# Patient Record
Sex: Female | Born: 1995 | Race: White | Hispanic: No | Marital: Single | Smoking: Former smoker
Health system: Southern US, Community
[De-identification: ages and names within clinical notes are randomized; demographics above are authoritative.]

## PROBLEM LIST (undated history)

## (undated) ENCOUNTER — Inpatient Hospital Stay (HOSPITAL_COMMUNITY): Payer: Self-pay

## (undated) ENCOUNTER — Emergency Department (HOSPITAL_COMMUNITY): Admission: EM | Payer: BC Managed Care – PPO | Source: Home / Self Care

## (undated) DIAGNOSIS — E282 Polycystic ovarian syndrome: Secondary | ICD-10-CM

## (undated) DIAGNOSIS — A749 Chlamydial infection, unspecified: Secondary | ICD-10-CM

## (undated) DIAGNOSIS — O039 Complete or unspecified spontaneous abortion without complication: Secondary | ICD-10-CM

## (undated) HISTORY — DX: Complete or unspecified spontaneous abortion without complication: O03.9

## (undated) HISTORY — DX: Chlamydial infection, unspecified: A74.9

## (undated) HISTORY — PX: NO PAST SURGERIES: SHX2092

## (undated) HISTORY — DX: Polycystic ovarian syndrome: E28.2

---
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
## 2017-04-03 ENCOUNTER — Ambulatory Visit: Payer: Self-pay | Admitting: Women's Health

## 2017-04-03 DIAGNOSIS — Z0289 Encounter for other administrative examinations: Secondary | ICD-10-CM

## 2017-04-13 ENCOUNTER — Ambulatory Visit (INDEPENDENT_AMBULATORY_CARE_PROVIDER_SITE_OTHER): Payer: Self-pay | Admitting: Adult Health

## 2017-04-13 ENCOUNTER — Encounter (INDEPENDENT_AMBULATORY_CARE_PROVIDER_SITE_OTHER): Payer: Self-pay

## 2017-04-13 ENCOUNTER — Encounter: Payer: Self-pay | Admitting: Adult Health

## 2017-04-13 VITALS — BP 110/80 | HR 82 | Ht 66.5 in | Wt 131.0 lb

## 2017-04-13 DIAGNOSIS — Z3A08 8 weeks gestation of pregnancy: Secondary | ICD-10-CM

## 2017-04-13 DIAGNOSIS — Z3201 Encounter for pregnancy test, result positive: Secondary | ICD-10-CM

## 2017-04-13 DIAGNOSIS — N926 Irregular menstruation, unspecified: Secondary | ICD-10-CM

## 2017-04-13 DIAGNOSIS — O3680X0 Pregnancy with inconclusive fetal viability, not applicable or unspecified: Secondary | ICD-10-CM

## 2017-04-13 DIAGNOSIS — R5383 Other fatigue: Secondary | ICD-10-CM

## 2017-04-13 LAB — POCT URINE PREGNANCY: Preg Test, Ur: POSITIVE — AB

## 2017-04-13 NOTE — Progress Notes (Signed)
Subjective:     Patient ID: [name], female   DOB: 03/12/1995, 21 y.o.   MRN: [id number]  HPI [name] is a 22 year old white female, single in for UPT, has missed period and had +HPT, has history of PCOs and is worried.   Review of Systems +missed period,with +HPT +spotted brown yesterday +tired vomited x 1  Reviewed past medical,surgical, social and family history. Reviewed medications and allergies.     Objective:   Physical Exam BP 110/80 (BP Location: Left Arm, Patient Position: Sitting, Cuff Size: Normal)   Pulse 82   Ht 5' 6.5" (1.689 m)   Wt 131 lb (59.4 kg)   LMP 02/16/2017   BMI 20.83 kg/m UPT +,about 8 weeks by LMP with EDD 11/23/17.Skin warm and dry.Has numerous tattoos. Neck: mid line trachea, normal thyroid, good ROM, no lymphadenopathy noted. Lungs: clear to ausculation bilaterally. Cardiovascular: regular rate and rhythm.Abdomen is soft and non tender.    Assessment:     1. Pregnancy examination or test, positive result   2. [redacted] weeks gestation of pregnancy   3. Encounter to determine fetal viability of pregnancy, single or unspecified fetus       Plan:     Take OTC PNV Return in 1 week for dating US Review handouts on First trimester and by [employer] [facility]  Eat often  Apply for pregnancy medicaid

## 2017-04-13 NOTE — Patient Instructions (Signed)
First Trimester of Pregnancy The first trimester of pregnancy is from week 1 until the end of week 13 (months 1 through 3). A week after a sperm fertilizes an egg, the egg will implant on the wall of the uterus. This embryo will begin to develop into a baby. Genes from you and your partner will form the baby. The female genes will determine whether the baby will be a boy or a girl. At 6-8 weeks, the eyes and face will be formed, and the heartbeat can be seen on ultrasound. At the end of 12 weeks, all the baby's organs will be formed. Now that you are pregnant, you will want to do everything you can to have a healthy baby. Two of the most important things are to get good prenatal care and to follow your health care provider's instructions. Prenatal care is all the medical care you receive before the baby's birth. This care will help prevent, find, and treat any problems during the pregnancy and childbirth. Body changes during your first trimester Your body goes through many changes during pregnancy. The changes vary from woman to woman.  You may gain or lose a couple of pounds at first.  You may feel sick to your stomach (nauseous) and you may throw up (vomit). If the vomiting is uncontrollable, call your health care provider.  You may tire easily.  You may develop headaches that can be relieved by medicines. All medicines should be approved by your health care provider.  You may urinate more often. Painful urination may mean you have a bladder infection.  You may develop heartburn as a result of your pregnancy.  You may develop constipation because certain hormones are causing the muscles that push stool through your intestines to slow down.  You may develop hemorrhoids or swollen veins (varicose veins).  Your breasts may begin to grow larger and become tender. Your nipples may stick out more, and the tissue that surrounds them (areola) may become darker.  Your gums may bleed and may be  sensitive to brushing and flossing.  Dark spots or blotches (chloasma, mask of pregnancy) may develop on your face. This will likely fade after the baby is born.  Your menstrual periods will stop.  You may have a loss of appetite.  You may develop cravings for certain kinds of food.  You may have changes in your emotions from day to day, such as being excited to be pregnant or being concerned that something may go wrong with the pregnancy and baby.  You may have more vivid and strange dreams.  You may have changes in your hair. These can include thickening of your hair, rapid growth, and changes in texture. Some women also have hair loss during or after pregnancy, or hair that feels dry or thin. Your hair will most likely return to normal after your baby is born.  What to expect at prenatal visits During a routine prenatal visit:  You will be weighed to make sure you and the baby are growing normally.  Your blood pressure will be taken.  Your abdomen will be measured to track your baby's growth.  The fetal heartbeat will be listened to between weeks 10 and 14 of your pregnancy.  Test results from any previous visits will be discussed.  Your health care provider may ask you:  How you are feeling.  If you are feeling the baby move.  If you have had any abnormal symptoms, such as leaking fluid, bleeding, severe headaches,   or abdominal cramping.  If you are using any tobacco products, including cigarettes, chewing tobacco, and electronic cigarettes.  If you have any questions.  Other tests that may be performed during your first trimester include:  Blood tests to find your blood type and to check for the presence of any previous infections. The tests will also be used to check for low iron levels (anemia) and protein on red blood cells (Rh antibodies). Depending on your risk factors, or if you previously had diabetes during pregnancy, you may have tests to check for high blood  sugar that affects pregnant women (gestational diabetes).  Urine tests to check for infections, diabetes, or protein in the urine.  An ultrasound to confirm the proper growth and development of the baby.  Fetal screens for spinal cord problems (spina bifida) and Down syndrome.  HIV (human immunodeficiency virus) testing. Routine prenatal testing includes screening for HIV, unless you choose not to have this test.  You may need other tests to make sure you and the baby are doing well.  Follow these instructions at home: Medicines  Follow your health care provider's instructions regarding medicine use. Specific medicines may be either safe or unsafe to take during pregnancy.  Take a prenatal vitamin that contains at least 600 micrograms (mcg) of folic acid.  If you develop constipation, try taking a stool softener if your health care provider approves. Eating and drinking  Eat a balanced diet that includes fresh fruits and vegetables, whole grains, good sources of protein such as meat, eggs, or tofu, and low-fat dairy. Your health care provider will help you determine the amount of weight gain that is right for you.  Avoid raw meat and uncooked cheese. These carry germs that can cause birth defects in the baby.  Eating four or five small meals rather than three large meals a day may help relieve nausea and vomiting. If you start to feel nauseous, eating a few soda crackers can be helpful. Drinking liquids between meals, instead of during meals, also seems to help ease nausea and vomiting.  Limit foods that are high in fat and processed sugars, such as fried and sweet foods.  To prevent constipation: ? Eat foods that are high in fiber, such as fresh fruits and vegetables, whole grains, and beans. ? Drink enough fluid to keep your urine clear or pale yellow. Activity  Exercise only as directed by your health care provider. Most women can continue their usual exercise routine during  pregnancy. Try to exercise for 30 minutes at least 5 days a week. Exercising will help you: ? Control your weight. ? Stay in shape. ? Be prepared for labor and delivery.  Experiencing pain or cramping in the lower abdomen or lower back is a good sign that you should stop exercising. Check with your health care provider before continuing with normal exercises.  Try to avoid standing for long periods of time. Move your legs often if you must stand in one place for a long time.  Avoid heavy lifting.  Wear low-heeled shoes and practice good posture.  You may continue to have sex unless your health care provider tells you not to. Relieving pain and discomfort  Wear a good support bra to relieve breast tenderness.  Take warm sitz baths to soothe any pain or discomfort caused by hemorrhoids. Use hemorrhoid cream if your health care provider approves.  Rest with your legs elevated if you have leg cramps or low back pain.  If you develop   varicose veins in your legs, wear support hose. Elevate your feet for 15 minutes, 3-4 times a day. Limit salt in your diet. Prenatal care  Schedule your prenatal visits by the twelfth week of pregnancy. They are usually scheduled monthly at first, then more often in the last 2 months before delivery.  Write down your questions. Take them to your prenatal visits.  Keep all your prenatal visits as told by your health care provider. This is important. Safety  Wear your seat belt at all times when driving.  Make a list of emergency phone numbers, including numbers for family, friends, the hospital, and police and fire departments. General instructions  Ask your health care provider for a referral to a local prenatal education class. Begin classes no later than the beginning of month 6 of your pregnancy.  Ask for help if you have counseling or nutritional needs during pregnancy. Your health care provider can offer advice or refer you to specialists for help  with various needs.  Do not use hot tubs, steam rooms, or saunas.  Do not douche or use tampons or scented sanitary pads.  Do not cross your legs for long periods of time.  Avoid cat litter boxes and soil used by cats. These carry germs that can cause birth defects in the baby and possibly loss of the fetus by miscarriage or stillbirth.  Avoid all smoking, herbs, alcohol, and medicines not prescribed by your health care provider. Chemicals in these products affect the formation and growth of the baby.  Do not use any products that contain nicotine or tobacco, such as cigarettes and e-cigarettes. If you need help quitting, ask your health care provider. You may receive counseling support and other resources to help you quit.  Schedule a dentist appointment. At home, brush your teeth with a soft toothbrush and be gentle when you floss. Contact a health care provider if:  You have dizziness.  You have mild pelvic cramps, pelvic pressure, or nagging pain in the abdominal area.  You have persistent nausea, vomiting, or diarrhea.  You have a bad smelling vaginal discharge.  You have pain when you urinate.  You notice increased swelling in your face, hands, legs, or ankles.  You are exposed to fifth disease or chickenpox.  You are exposed to German measles (rubella) and have never had it. Get help right away if:  You have a fever.  You are leaking fluid from your vagina.  You have spotting or bleeding from your vagina.  You have severe abdominal cramping or pain.  You have rapid weight gain or loss.  You vomit blood or material that looks like coffee grounds.  You develop a severe headache.  You have shortness of breath.  You have any kind of trauma, such as from a fall or a car accident. Summary  The first trimester of pregnancy is from week 1 until the end of week 13 (months 1 through 3).  Your body goes through many changes during pregnancy. The changes vary from  woman to woman.  You will have routine prenatal visits. During those visits, your health care provider will examine you, discuss any test results you may have, and talk with you about how you are feeling. This information is not intended to replace advice given to you by your health care provider. Make sure you discuss any questions you have with your health care provider. Document Released: 12/13/2000 Document Revised: 12/01/2015 Document Reviewed: 12/01/2015 Elsevier Interactive Patient Education  2018 Elsevier   Inc.  

## 2017-04-15 ENCOUNTER — Encounter (HOSPITAL_COMMUNITY): Payer: Self-pay | Admitting: *Deleted

## 2017-04-15 ENCOUNTER — Inpatient Hospital Stay (HOSPITAL_COMMUNITY)
Admission: AD | Admit: 2017-04-15 | Discharge: 2017-04-15 | Disposition: A | Discharge status: Home / Self Care | Payer: Self-pay | Source: Ambulatory Visit | Attending: Obstetrics & Gynecology | Admitting: Obstetrics & Gynecology

## 2017-04-15 ENCOUNTER — Other Ambulatory Visit: Payer: Self-pay

## 2017-04-15 ENCOUNTER — Inpatient Hospital Stay (HOSPITAL_COMMUNITY): Payer: Self-pay

## 2017-04-15 DIAGNOSIS — Z87891 Personal history of nicotine dependence: Secondary | ICD-10-CM | POA: Insufficient documentation

## 2017-04-15 DIAGNOSIS — Z3A01 Less than 8 weeks gestation of pregnancy: Secondary | ICD-10-CM | POA: Insufficient documentation

## 2017-04-15 DIAGNOSIS — O23591 Infection of other part of genital tract in pregnancy, first trimester: Secondary | ICD-10-CM | POA: Insufficient documentation

## 2017-04-15 DIAGNOSIS — O283 Abnormal ultrasonic finding on antenatal screening of mother: Secondary | ICD-10-CM

## 2017-04-15 DIAGNOSIS — R1032 Left lower quadrant pain: Secondary | ICD-10-CM | POA: Insufficient documentation

## 2017-04-15 DIAGNOSIS — Z825 Family history of asthma and other chronic lower respiratory diseases: Secondary | ICD-10-CM | POA: Insufficient documentation

## 2017-04-15 DIAGNOSIS — O99281 Endocrine, nutritional and metabolic diseases complicating pregnancy, first trimester: Secondary | ICD-10-CM | POA: Insufficient documentation

## 2017-04-15 DIAGNOSIS — N760 Acute vaginitis: Secondary | ICD-10-CM

## 2017-04-15 DIAGNOSIS — E282 Polycystic ovarian syndrome: Secondary | ICD-10-CM | POA: Insufficient documentation

## 2017-04-15 DIAGNOSIS — R109 Unspecified abdominal pain: Secondary | ICD-10-CM

## 2017-04-15 DIAGNOSIS — B9689 Other specified bacterial agents as the cause of diseases classified elsewhere: Secondary | ICD-10-CM | POA: Insufficient documentation

## 2017-04-15 DIAGNOSIS — O3680X0 Pregnancy with inconclusive fetal viability, not applicable or unspecified: Secondary | ICD-10-CM

## 2017-04-15 DIAGNOSIS — O209 Hemorrhage in early pregnancy, unspecified: Secondary | ICD-10-CM | POA: Insufficient documentation

## 2017-04-15 DIAGNOSIS — O26891 Other specified pregnancy related conditions, first trimester: Secondary | ICD-10-CM | POA: Insufficient documentation

## 2017-04-15 LAB — CBC
HCT: 36.3 % (ref 36.0–46.0)
Hemoglobin: 12.5 g/dL (ref 12.0–15.0)
MCH: 31 pg (ref 26.0–34.0)
MCHC: 34.4 g/dL (ref 30.0–36.0)
MCV: 90.1 fL (ref 78.0–100.0)
PLATELETS: 162 10*3/uL (ref 150–400)
RBC: 4.03 MIL/uL (ref 3.87–5.11)
RDW: 12.4 % (ref 11.5–15.5)
WBC: 7 10*3/uL (ref 4.0–10.5)

## 2017-04-15 LAB — URINALYSIS, ROUTINE W REFLEX MICROSCOPIC
BILIRUBIN URINE: NEGATIVE
Glucose, UA: NEGATIVE mg/dL
HGB URINE DIPSTICK: NEGATIVE
Ketones, ur: NEGATIVE mg/dL
Leukocytes, UA: NEGATIVE
Nitrite: NEGATIVE
PH: 7 (ref 5.0–8.0)
Protein, ur: NEGATIVE mg/dL
SPECIFIC GRAVITY, URINE: 1.002 — AB (ref 1.005–1.030)

## 2017-04-15 LAB — WET PREP, GENITAL
SPERM: NONE SEEN
Trich, Wet Prep: NONE SEEN
Yeast Wet Prep HPF POC: NONE SEEN

## 2017-04-15 LAB — HCG, QUANTITATIVE, PREGNANCY: hCG, Beta Chain, Quant, S: 28499 m[IU]/mL — ABNORMAL HIGH (ref <5)

## 2017-04-15 MED ORDER — RHO D IMMUNE GLOBULIN 1500 UNIT/2ML IJ SOSY
300.0000 ug | PREFILLED_SYRINGE | Freq: Once | INTRAMUSCULAR | Status: AC
  Administered 2017-04-15: 300 ug via INTRAMUSCULAR
  Filled 2017-04-15: qty 2

## 2017-04-15 MED ORDER — METRONIDAZOLE 0.75 % VA GEL: 1.0000 | Freq: Two times a day (BID) | VAGINAL | 0 refills | Status: DC

## 2017-04-15 NOTE — MAU Provider Note (Addendum)
History     CSN: [id number]  Arrival date and time: 04/15/17 1847   First Provider Initiated Contact with Patient 04/15/17 1928      Chief Complaint  Patient presents with  . Vaginal Bleeding  . Abdominal Pain   HPI  [name] is a 22 y.o. G1P0000 at 8552w2d by unsure LMP who presents with abdominal pain and vaginal bleeding. Pt with history of PCOS and very irregular periods. Had first positive pregnancy test 4 wks ago with a negative test the week prior to that.  Reports lower abdominal pain for the last week. Feels like pain is in her cervix and her LLQ. Pain is cramping and intermittent. Rates pain 5/10. Has not treated. Has also noticed brown spotting for the last week. Some nausea; no vomiting or diarrhea. Denies dysuria or vaginal discharge. Last intercourse was 1 week ago.   OB History    Gravida  1   Para  0   Term  0   Preterm  0   AB  0   Living  0     SAB  0   TAB  0   Ectopic  0   Multiple  0   Live Births              Past Medical History:  Diagnosis Date  . PCOS (polycystic ovarian syndrome)     Past Surgical History:  Procedure Laterality Date  . NO PAST SURGERIES      Family History  Problem Relation Age of Onset  . Pulmonary embolism Father   . COPD Father   . Cancer Mother        brain    Social History   Tobacco Use  . Smoking status: Former Smoker    Types: Cigarettes  . Smokeless tobacco: Never Used  Substance Use Topics  . Alcohol use: No    Alcohol/week: 0.0 oz  . Drug use: No    Allergies: No Known Allergies  Medications Prior to Admission  Medication Sig Dispense Refill Last Dose  . Prenatal Vit-Fe Fumarate-FA (MULTIVITAMIN-PRENATAL) 27-0.8 MG TABS tablet Take 1 tablet by mouth daily at 12 noon.       Review of Systems  Constitutional: Negative.   Gastrointestinal: Positive for abdominal pain and nausea. Negative for constipation, diarrhea and vomiting.  Genitourinary: Positive for vaginal bleeding.  Negative for dyspareunia, dysuria and vaginal discharge.   Physical Exam   Blood pressure 128/77, pulse 87, temperature 98.7 F (37.1 C), temperature source Oral, resp. rate 18, weight 131 lb (59.4 kg), last menstrual period 02/16/2017, SpO2 100 %.  Physical Exam  Nursing note and vitals reviewed. Constitutional: She is oriented to person, place, and time. She appears well-developed and well-nourished. No distress.  HENT:  Head: Normocephalic and atraumatic.  Eyes: Conjunctivae are normal. Right eye exhibits no discharge. Left eye exhibits no discharge. No scleral icterus.  Neck: Normal range of motion.  Respiratory: Effort normal. No respiratory distress.  GI: Soft. There is tenderness in the left lower quadrant. There is no rigidity, no rebound and no guarding.  Genitourinary: Cervix exhibits no motion tenderness and no friability. Left adnexum displays tenderness. No bleeding in the vagina. Vaginal discharge (moderate amount of tan watery discharge) found.  Genitourinary Comments: Cervix closed  Neurological: She is alert and oriented to person, place, and time.  Skin: Skin is warm and dry. She is not diaphoretic.  Psychiatric: She has a normal mood and affect. Her behavior is normal. Judgment  and thought content normal.    MAU Course  Procedures Results for orders placed or performed during the hospital encounter of 04/15/17 (from the past 24 hour(s))  Urinalysis, Routine w reflex microscopic     Status: Abnormal   Collection Time: 04/15/17  6:50 PM  Result Value Ref Range   Color, Urine STRAW (A) YELLOW   APPearance CLEAR CLEAR   Specific Gravity, Urine 1.002 (L) 1.005 - 1.030   pH 7.0 5.0 - 8.0   Glucose, UA NEGATIVE NEGATIVE mg/dL   Hgb urine dipstick NEGATIVE NEGATIVE   Bilirubin Urine NEGATIVE NEGATIVE   Ketones, ur NEGATIVE NEGATIVE mg/dL   Protein, ur NEGATIVE NEGATIVE mg/dL   Nitrite NEGATIVE NEGATIVE   Leukocytes, UA NEGATIVE NEGATIVE  Wet prep, genital      Status: Abnormal   Collection Time: 04/15/17  7:50 PM  Result Value Ref Range   Yeast Wet Prep HPF POC NONE SEEN NONE SEEN   Trich, Wet Prep NONE SEEN NONE SEEN   Clue Cells Wet Prep HPF POC PRESENT (A) NONE SEEN   WBC, Wet Prep HPF POC MODERATE (A) NONE SEEN   Sperm NONE SEEN   CBC     Status: None   Collection Time: 04/15/17  7:51 PM  Result Value Ref Range   WBC 7.0 4.0 - 10.5 K/uL   RBC 4.03 3.87 - 5.11 MIL/uL   Hemoglobin 12.5 12.0 - 15.0 g/dL   HCT 40.9 81.1 - 91.4 %   MCV 90.1 78.0 - 100.0 fL   MCH 31.0 26.0 - 34.0 pg   MCHC 34.4 30.0 - 36.0 g/dL   RDW 78.2 95.6 - 21.3 %   Platelets 162 150 - 400 K/uL  ABO/Rh     Status: None (Preliminary result)   Collection Time: 04/15/17  7:51 PM  Result Value Ref Range   ABO/RH(D)      O NEG Performed at [facility], [address], [place], [place] [address]   hCG, quantitative, pregnancy     Status: Abnormal   Collection Time: 04/15/17  7:51 PM  Result Value Ref Range   hCG, Beta Chain, Quant, S 28,499 (H) <5 mIU/mL  Rh IG workup (includes ABO/Rh)     Status: None (Preliminary result)   Collection Time: 04/15/17  7:51 PM  Result Value Ref Range   Gestational Age(Wks) 8    ABO/RH(D) O NEG    Antibody Screen      NEG Performed at [facility], [address], [place], [place] [address]    Unit Number [id number]    Blood Component Type RHIG    Unit division 00    Status of Unit ALLOCATED    Transfusion Status OK TO TRANSFUSE     US Ob Less Than 14 Weeks With Ob Transvaginal  Result Date: 04/15/2017 CLINICAL DATA:  Pregnant, pain x2 weeks, spotting EXAM: OBSTETRIC <14 WK [place] AND TRANSVAGINAL OB US TECHNIQUE: Both transabdominal and transvaginal ultrasound examinations were performed for complete evaluation of the gestation as well as the maternal uterus, adnexal regions, and pelvic cul-de-sac. Transvaginal technique was performed to assess early pregnancy. COMPARISON:  None. FINDINGS: Intrauterine gestational sac:  Single Yolk sac:  Not Visualized. Embryo:  Not Visualized. MSD: 17.0 mm   6 w   4 d Subchorionic hemorrhage:  None visualized. Maternal uterus/adnexae: Bilateral ovaries are within normal limits, noting a right corpus luteal cyst. No free fluid. IMPRESSION: Single intrauterine gestational sac, measuring 6 weeks 4 days by mean sac diameter, without  fetal pole. Findings are suspicious but not yet definitive for failed pregnancy, given the absence of a visualized embryo despite the size of the gestational sac. Recommend follow-up US in 14 days for definitive diagnosis. This recommendation follows SRU consensus guidelines: Diagnostic Criteria for Nonviable Pregnancy Early in the First Trimester. Malva Limes Med 2013; 433:2951-88. Electronically Signed   By: Charline Bills M.D.   On: 04/15/2017 21:24   MDM +UPT UA, wet prep, GC/chlamydia, CBC, ABO/Rh, quant hCG, HIV, and [place] today to rule out ectopic pregnancy  Care turned over to [facility] CNM   Judeth Horn, NP 04/15/2017 8:08 PM   O Neg blood type- Rhogam work up ordered  Assessment and Plan   1. Pregnancy of unknown anatomic location   2. Vaginal bleeding in pregnancy, first trimester   3. Abdominal pain during pregnancy in first trimester   4. [redacted] weeks gestation of pregnancy   5. Bacterial vaginosis    -Discharge home in stable condition -Rx for metrogel sent to patient's pharmacy -Strict ectopic precautions discussed -Patient advised to follow-up with [facility] on Wednesday for repeat blood work, message sent -Patient may return to MAU as needed or if her condition were to change or worsen  AALIVIA MCGRAW, [place] 04/15/17 9:53 PM

## 2017-04-15 NOTE — Discharge Instructions (Signed)

## 2017-04-15 NOTE — MAU Note (Signed)
Saw OB a few days ago, it was end of the day and seemed to be rushing. Didn't seem to be listening, didn't have an US.  Has had cramping and spotting, and pain in left ovary. Cramping has been the entire time, last 1-2 wks has gotten worse. Started spotting on Wed.

## 2017-04-15 NOTE — MAU Note (Signed)
Pt reports spotting and pain. Spotting since Wednesday. Pain for a couple of weeks. Pt reports that she has had recurrent BV infections. Last one was about a year. Pt reports cramping that comes in waves on her left "ovary" and also near her cervix and bladder.

## 2017-04-16 LAB — RH IG WORKUP (INCLUDES ABO/RH)
ABO/RH(D): O NEG
ANTIBODY SCREEN: NEGATIVE
GESTATIONAL AGE(WKS): 8
Unit division: 0

## 2017-04-16 LAB — ABO/RH: ABO/RH(D): O NEG

## 2017-04-16 LAB — GC/CHLAMYDIA PROBE AMP (~~LOC~~) NOT AT ARMC
Chlamydia: NEGATIVE
Neisseria Gonorrhea: NEGATIVE

## 2017-04-16 LAB — HIV ANTIBODY (ROUTINE TESTING W REFLEX): HIV SCREEN 4TH GENERATION: NONREACTIVE

## 2017-04-18 ENCOUNTER — Other Ambulatory Visit: Payer: Self-pay

## 2017-04-18 DIAGNOSIS — O200 Threatened abortion: Secondary | ICD-10-CM

## 2017-04-19 ENCOUNTER — Telehealth: Payer: Self-pay | Admitting: Adult Health

## 2017-04-19 ENCOUNTER — Other Ambulatory Visit: Payer: Self-pay

## 2017-04-19 LAB — BETA HCG QUANT (REF LAB): HCG QUANT: 37861 m[IU]/mL

## 2017-04-19 NOTE — Telephone Encounter (Signed)
[facility]  [facility], which is rising but not doubling from 4/14 when was 28,499, need F/U US, call back

## 2017-04-19 NOTE — Telephone Encounter (Signed)
Patient called stating that she would like to know the results of her blood work.  °Please contact pt °

## 2017-04-19 NOTE — Telephone Encounter (Signed)
Left message to call about labs 

## 2017-04-27 ENCOUNTER — Ambulatory Visit (INDEPENDENT_AMBULATORY_CARE_PROVIDER_SITE_OTHER): Payer: Self-pay

## 2017-04-27 DIAGNOSIS — Z3A08 8 weeks gestation of pregnancy: Secondary | ICD-10-CM

## 2017-04-27 DIAGNOSIS — O3680X0 Pregnancy with inconclusive fetal viability, not applicable or unspecified: Secondary | ICD-10-CM

## 2017-04-27 DIAGNOSIS — O020 Blighted ovum and nonhydatidiform mole: Secondary | ICD-10-CM

## 2017-04-27 MED ORDER — MISOPROSTOL 200 MCG PO TABS: 800.0000 ug | ORAL_TABLET | Freq: Once | ORAL | 1 refills | Status: DC

## 2017-04-27 NOTE — Progress Notes (Signed)
US TA/TV:8+3 wks GS,no fetal pole or YS,normal ovaries bilat,GS 33.6 mm,Kim discussed results w/pt.

## 2017-04-27 NOTE — Progress Notes (Unsigned)
[facility] on today's us, so spoke w/ pt in u/s room.  Bonnie NeedlesCaroline L [name] is a 22 y.o. G1P0000 female at 6670w2d by early u/s. On 04/15/17 she went to MAU for pain and spotting, u/s revealed GS c/w 566w4d, no yolk sac or embryo, normal ovaries, no free fluid. BHCG was 28,499, she is O-, so she received Rhogam 04/15/17. Repeat HCG on 4/17: 37, 861. U/S today: US TA/TV:8+3 wks GS,no fetal pole or YS,normal ovaries bilat,GS 33.6 mm, c/w blighted ovum. Discussed options of expectant management vs. cytotec- prefers cytotec. Rx cytotec 800mcg pv x1, repeat in 48hrs if needed. Discussed normal expectations w/ miscarriage, as well as warning s/s to report/reasons to seek care. Will f/u in 1wk.  Cheral MarkerKimberly R. Rosemae Mcquown, CNM, [facility]NP-BC 04/27/2017 2:06 PM

## 2017-04-27 NOTE — Patient Instructions (Signed)
FACTS YOU SHOULD KNOW  About Early Pregnancy Loss  WHAT IS AN EARLY PREGNANCY LOSS? Once the egg is fertilized with the sperm and begins to develop, it attaches to the lining of the uterus. This early pregnancy tissue may not develop into an embryo (the beginning stage of a baby). Sometimes an embryo does develop but does not continue to grow. These problems can be seen on ultrasound.   MANAGEMNT OF EARLY PREGNANCY LOSS: About 4 out of 100 (0.25%) women will have a pregnancy loss in her lifetime.  One in five pregnancies is found to be an early pregnancy loss.  There are 3 ways to care for an early pregnancy loss:   (1) Surgery, (2) Medicine, (3) Waiting for you to pass the pregnancy on your own. The decision as to how to proceed after being diagnosed with and early pregnancy loss is an individual one.  The decision can be made only after appropriate counseling.  You need to weigh the pros and cons of the 3 choices. Then you can make the choice that works for you.  SURGERY (D&E) . Procedure over in 1 day . Requires being put to sleep . Bleeding may be light . Possible problems during surgery, including injury to womb(uterus) . Care provider has more control Medicine (CYTOTEC) . The complete procedure may take days to weeks . No Surgery . Bleeding may be heavy at times . There may be drug side effects . Patient has more control Waiting . You may choose to wait, in which case your own body may complete the passing of the abnormal early pregnancy on its own in about 2-4 weeks . Your bleeding may be heavy at times . There is a small possibility that you may need surgery if the bleeding is too much or not all of the pregnancy has passed.  CYTOTEC MANAGEMENT Prostaglandins (cytotec) are the most widely used drug for this purpose. They cause the uterus to cramp and contract. You will place the medicine yourself inside your vagina in the privacy of your home. Empting of the uterus should occur  within 3 days but the process may continue for several weeks. The bleeding may seem heavy at times.  INSTRUCTIONS: Take all 4 tablets of cytotec (800mcg total) at one time. This will cause a lot of cramping, you may have bleeding, and pass tissue, then the cramping and bleeding should get better. If you do not pass the tissue, then you can take 4 more tablets of cytotec (800mcg total) 48 hours after your first dose.  You will come back to have your blood drawn to make sure the pregnancy hormones are dropping in 1 week. Please call us if you have any questions.   POSSIBLE SIDE EFFECTS FROM CYTOTEC . Nausea  Vomiting . Diarrhea Fever . Chills  Hot Flashes Side effects  from the process of the early pregnancy loss include: . Cramping  Bleeding . Headaches  Dizziness RISKS: This is a low risk procedure. Less than 1 in 100 women has a complication. An incomplete passage of the early pregnancy may occur. Also, hemorrhage (heavy bleeding) could happen.  Rarely the pregnancy will not be passed completely. Excessively heavy bleeding may occur.  Your doctor may need to perform surgery to empty the uterus (D&E). Afterwards: Everybody will feel differently after the early pregnancy loss completion. You may have soreness or cramps for a day or two. You may have soreness or cramps for day or two.  You may have light   bleeding for up to 2 weeks. You may be as active as you feel like being. If you have any of the following problems you may call [facility] at [phone] or Maternity Admissions Unit at [phone] if it is after hours. . If you have pain that does not get better with pain medication . Bleeding that soaks through 2 thick full-sized sanitary pads in an hour . Cramps that last longer than 2 days . Foul smelling discharge . Fever above 100.4 degrees F Even if you do not have any of these symptoms, you should have a follow-up exam to make sure you are healing properly. Your next normal period will  usually start again in 4-6 week after the loss. You can get pregnant soon after the loss, so use birth control right away. Finally: Make sure all your questions are answered before during and after any procedure. Follow up with medical care and family planning methods.      

## 2017-05-02 DIAGNOSIS — O039 Complete or unspecified spontaneous abortion without complication: Secondary | ICD-10-CM

## 2017-05-02 HISTORY — DX: Complete or unspecified spontaneous abortion without complication: O03.9

## 2017-05-03 ENCOUNTER — Ambulatory Visit: Payer: Self-pay | Admitting: Women's Health

## 2017-05-14 ENCOUNTER — Inpatient Hospital Stay (HOSPITAL_COMMUNITY)
Admission: AD | Admit: 2017-05-14 | Discharge: 2017-05-14 | Discharge status: Against Medical Advice | Payer: Self-pay | Source: Ambulatory Visit | Attending: Obstetrics and Gynecology | Admitting: Obstetrics and Gynecology

## 2017-05-14 DIAGNOSIS — Z5321 Procedure and treatment not carried out due to patient leaving prior to being seen by health care provider: Secondary | ICD-10-CM | POA: Insufficient documentation

## 2017-05-14 NOTE — MAU Note (Signed)
Not in lobby

## 2017-05-14 NOTE — MAU Note (Signed)
Not on lobby 

## 2017-05-14 NOTE — MAU Note (Signed)
Pt reports she took cytotec on Fri for a blighted ovum and had bleeding but the bleeding worsened today and the pain got much worse.

## 2017-08-15 ENCOUNTER — Ambulatory Visit (INDEPENDENT_AMBULATORY_CARE_PROVIDER_SITE_OTHER): Payer: Self-pay | Admitting: Adult Health

## 2017-08-15 ENCOUNTER — Encounter: Payer: Self-pay | Admitting: Adult Health

## 2017-08-15 VITALS — BP 127/87 | HR 119 | Ht 66.5 in | Wt 124.5 lb

## 2017-08-15 DIAGNOSIS — N898 Other specified noninflammatory disorders of vagina: Secondary | ICD-10-CM | POA: Insufficient documentation

## 2017-08-15 DIAGNOSIS — B9689 Other specified bacterial agents as the cause of diseases classified elsewhere: Secondary | ICD-10-CM | POA: Insufficient documentation

## 2017-08-15 DIAGNOSIS — N926 Irregular menstruation, unspecified: Secondary | ICD-10-CM

## 2017-08-15 DIAGNOSIS — N760 Acute vaginitis: Secondary | ICD-10-CM

## 2017-08-15 DIAGNOSIS — Z3202 Encounter for pregnancy test, result negative: Secondary | ICD-10-CM

## 2017-08-15 LAB — POCT WET PREP (WET MOUNT)
Clue Cells Wet Prep Whiff POC: POSITIVE
WBC WET PREP: POSITIVE

## 2017-08-15 LAB — POCT URINE PREGNANCY: PREG TEST UR: NEGATIVE

## 2017-08-15 MED ORDER — METRONIDAZOLE 500 MG PO TABS: 500.0000 mg | ORAL_TABLET | Freq: Two times a day (BID) | ORAL | 0 refills | Status: DC

## 2017-08-15 NOTE — Patient Instructions (Signed)
Bacterial Vaginosis Bacterial vaginosis is a vaginal infection that occurs when the normal balance of bacteria in the vagina is disrupted. It results from an overgrowth of certain bacteria. This is the most common vaginal infection among women ages 15-44. Because bacterial vaginosis increases your risk for STIs (sexually transmitted infections), getting treated can help reduce your risk for chlamydia, gonorrhea, herpes, and HIV (human immunodeficiency virus). Treatment is also important for preventing complications in pregnant women, because this condition can cause an early (premature) delivery. What are the causes? This condition is caused by an increase in harmful bacteria that are normally present in small amounts in the vagina. However, the reason that the condition develops is not fully understood. What increases the risk? The following factors may make you more likely to develop this condition:  Having a new sexual partner or multiple sexual partners.  Having unprotected sex.  Douching.  Having an intrauterine device (IUD).  Smoking.  Drug and alcohol abuse.  Taking certain antibiotic medicines.  Being pregnant.  You cannot get bacterial vaginosis from toilet seats, bedding, swimming pools, or contact with objects around you. What are the signs or symptoms? Symptoms of this condition include:  Grey or white vaginal discharge. The discharge can also be watery or foamy.  A fish-like odor with discharge, especially after sexual intercourse or during menstruation.  Itching in and around the vagina.  Burning or pain with urination.  Some women with bacterial vaginosis have no signs or symptoms. How is this diagnosed? This condition is diagnosed based on:  Your medical history.  A physical exam of the vagina.  Testing a sample of vaginal fluid under a microscope to look for a large amount of bad bacteria or abnormal cells. Your health care provider may use a cotton swab  or a small wooden spatula to collect the sample.  How is this treated? This condition is treated with antibiotics. These may be given as a pill, a vaginal cream, or a medicine that is put into the vagina (suppository). If the condition comes back after treatment, a second round of antibiotics may be needed. Follow these instructions at home: Medicines  Take over-the-counter and prescription medicines only as told by your health care provider.  Take or use your antibiotic as told by your health care provider. Do not stop taking or using the antibiotic even if you start to feel better. General instructions  If you have a female sexual partner, tell her that you have a vaginal infection. She should see her health care provider and be treated if she has symptoms. If you have a female sexual partner, he does not need treatment.  During treatment: ? Avoid sexual activity until you finish treatment. ? Do not douche. ? Avoid alcohol as directed by your health care provider. ? Avoid breastfeeding as directed by your health care provider.  Drink enough water and fluids to keep your urine clear or pale yellow.  Keep the area around your vagina and rectum clean. ? Wash the area daily with warm water. ? Wipe yourself from front to back after using the toilet.  Keep all follow-up visits as told by your health care provider. This is important. How is this prevented?  Do not douche.  Wash the outside of your vagina with warm water only.  Use protection when having sex. This includes latex condoms and dental dams.  Limit how many sexual partners you have. To help prevent bacterial vaginosis, it is best to have sex with just   one partner (monogamous).  Make sure you and your sexual partner are tested for STIs.  Wear cotton or cotton-lined underwear.  Avoid wearing tight pants and pantyhose, especially during summer.  Limit the amount of alcohol that you drink.  Do not use any products that  contain nicotine or tobacco, such as cigarettes and e-cigarettes. If you need help quitting, ask your health care provider.  Do not use illegal drugs. Where to find more information:  Centers for Disease Control and Prevention: www.cdc.gov/std  American Sexual Health Association (ASHA): www.ashastd.org  U.S. Department of Health and Human Services, Office on Women's Health: www.womenshealth.gov/ or https://www.womenshealth.gov/a-z-topics/bacterial-vaginosis Contact a health care provider if:  Your symptoms do not improve, even after treatment.  You have more discharge or pain when urinating.  You have a fever.  You have pain in your abdomen.  You have pain during sex.  You have vaginal bleeding between periods. Summary  Bacterial vaginosis is a vaginal infection that occurs when the normal balance of bacteria in the vagina is disrupted.  Because bacterial vaginosis increases your risk for STIs (sexually transmitted infections), getting treated can help reduce your risk for chlamydia, gonorrhea, herpes, and HIV (human immunodeficiency virus). Treatment is also important for preventing complications in pregnant women, because the condition can cause an early (premature) delivery.  This condition is treated with antibiotic medicines. These may be given as a pill, a vaginal cream, or a medicine that is put into the vagina (suppository). This information is not intended to replace advice given to you by your health care provider. Make sure you discuss any questions you have with your health care provider. Document Released: 12/19/2004 Document Revised: 04/24/2016 Document Reviewed: 09/04/2015 Elsevier Interactive Patient Education  2018 Elsevier Inc.  

## 2017-08-15 NOTE — Progress Notes (Signed)
  Subjective:     Patient ID: [name], female   DOB: 03/28/1995, 22 y.o.   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 And had miscarriage in May.  PCP is Dr Phillips OdorGolding.   Review of Systems +vaginal discharge with odor +irregular periods, spotting on and off Reviewed past medical,surgical, social and family history. Reviewed medications and allergies.     Objective:   Physical Exam BP 127/87 (BP Location: Left Arm, Patient Position: Sitting, Cuff Size: Normal)   Pulse (!) 119   Ht 5' 6.5" (1.689 m)   Wt 124 lb 8 oz (56.5 kg)   LMP 06/16/2017 (Approximate)   Breastfeeding? No   BMI 19.79 kg/m   UPT negative, Skin warm and dry.+tattoos. Pelvic: external genitalia is normal in appearance no lesions, vagina: white discharge with odor,urethra has no lesions or masses noted, cervix:nulliparous, uterus: normal size, shape and contour, non tender, no masses felt, adnexa: no masses or tenderness noted. Bladder is non tender and no masses felt. Wet prep: + for clue cells and +WBCs. Examination chaperoned by Malachy MoodJanet Young LPN.    Assessment:       ICD-10-CM   1. BV (bacterial vaginosis) N76.0 POCT Wet Prep [facility]([place])   B96.89   2. Vaginal discharge N89.8 POCT Wet Prep [facility]([place])  3. Irregular periods N92.6   4. Vaginal odor N89.8 POCT Wet Prep [facility]([place])  5. Pregnancy examination or test, negative result Z32.02 POCT urine pregnancy      Plan:     Meds ordered this encounter  Medications  . metroNIDAZOLE (FLAGYL) 500 MG tablet    Sig: Take 1 tablet (500 mg total) by mouth 2 (two) times daily.    Dispense:  14 tablet    Refill:  0    Order Specific Question:   Supervising Provider    Answer:   Duane LopeEURE, LUTHER H [[id number]]  No sex or alcohol while taking meds Review handout on BV F/U prn Needs pap now that 22

## 2017-08-30 ENCOUNTER — Encounter: Payer: Self-pay | Admitting: Adult Health

## 2017-08-30 ENCOUNTER — Ambulatory Visit (INDEPENDENT_AMBULATORY_CARE_PROVIDER_SITE_OTHER): Payer: Self-pay | Admitting: Adult Health

## 2017-08-30 VITALS — BP 131/88 | HR 110 | Ht 66.5 in | Wt 122.5 lb

## 2017-08-30 DIAGNOSIS — N898 Other specified noninflammatory disorders of vagina: Secondary | ICD-10-CM

## 2017-08-30 LAB — POCT WET PREP (WET MOUNT): CLUE CELLS WET PREP WHIFF POC: NEGATIVE

## 2017-08-30 NOTE — Progress Notes (Signed)
  Subjective:     Patient ID: [name], female   DOB: 08/24/1995, 22 y.o.   MRN: [id number]  HPI [name] is a 22 year old white female, in complaining of discharge, took flagyl for BV recently and wants to make sure it is gone.   Review of Systems +vaginal discharge Reviewed past medical,surgical, social and family history. Reviewed medications and allergies.     Objective:   Physical Exam BP 131/88 (BP Location: Left Arm, Patient Position: Sitting, Cuff Size: Normal)   Pulse (!) 110   Ht 5' 6.5" (1.689 m)   Wt 122 lb 8 oz (55.6 kg)   LMP 06/16/2017   BMI 19.48 kg/m  Skin warm and dry.Pelvic: external genitalia is normal in appearance no lesions, vagina: white discharge without odor,urethra has no lesions or masses noted, cervix:smooth, uterus: normal size, shape and contour, non tender, no masses felt, adnexa: no masses or tenderness noted. Bladder is non tender and no masses felt. Wet prep: few +WBCs.     Assessment:     1. Vaginal discharge       Plan:     F/U prn Has appt 9/24 for pap

## 2017-09-25 ENCOUNTER — Other Ambulatory Visit: Payer: Self-pay | Admitting: Adult Health

## 2017-10-09 ENCOUNTER — Ambulatory Visit (INDEPENDENT_AMBULATORY_CARE_PROVIDER_SITE_OTHER): Payer: Self-pay | Admitting: Adult Health

## 2017-10-09 ENCOUNTER — Encounter: Payer: Self-pay | Admitting: Adult Health

## 2017-10-09 VITALS — BP 117/75 | HR 80 | Ht 66.0 in | Wt 129.0 lb

## 2017-10-09 DIAGNOSIS — N760 Acute vaginitis: Secondary | ICD-10-CM

## 2017-10-09 DIAGNOSIS — Z113 Encounter for screening for infections with a predominantly sexual mode of transmission: Secondary | ICD-10-CM

## 2017-10-09 DIAGNOSIS — N898 Other specified noninflammatory disorders of vagina: Secondary | ICD-10-CM

## 2017-10-09 DIAGNOSIS — B9689 Other specified bacterial agents as the cause of diseases classified elsewhere: Secondary | ICD-10-CM

## 2017-10-09 LAB — POCT WET PREP (WET MOUNT)
CLUE CELLS WET PREP WHIFF POC: NEGATIVE
WBC, Wet Prep HPF POC: POSITIVE

## 2017-10-09 LAB — POCT URINALYSIS DIPSTICK
Blood, UA: NEGATIVE
GLUCOSE UA: NEGATIVE
Leukocytes, UA: NEGATIVE
Nitrite, UA: NEGATIVE
ODOR: NEGATIVE
Protein, UA: NEGATIVE

## 2017-10-09 MED ORDER — METRONIDAZOLE 500 MG PO TABS: 500.0000 mg | ORAL_TABLET | Freq: Two times a day (BID) | ORAL | 0 refills | Status: DC

## 2017-10-09 MED ORDER — METRONIDAZOLE 500 MG PO TABS: 500.0000 mg | ORAL_TABLET | Freq: Two times a day (BID) | ORAL | 1 refills | Status: DC

## 2017-10-09 NOTE — Progress Notes (Signed)
  Subjective:     Patient ID: [name], female   DOB: 12-16-1995, 22 y.o.   MRN: [id number]  HPI [name] is a 22 year old white female in complaining of vaginal discharge, burning and odor and wants STD testing,says BF has ben talking to other girl on his phone.She says had low grade fever last week at PCP. PCP is Dr Phillips Odor.   Review of Systems +vaginal discharge +burning  +odor Reviewed past medical,surgical, social and family history. Reviewed medications and allergies.     Objective:   Physical Exam BP 117/75 (BP Location: Left Arm, Patient Position: Sitting, Cuff Size: Normal)   Pulse 80   Ht 5\' 6"  (1.676 m)   Wt 129 lb (58.5 kg)   LMP  (LMP Unknown)   BMI 20.82 kg/m  Urine dipstick negative.  Skin warm and dry.Numerous tattoos and piercing's  Pelvic: external genitalia is normal in appearance no lesions, vagina:scant white discharge without odor,urethra has no lesions or masses noted, cervix:smooth, uterus: normal size, shape and contour, non tender, no masses felt, adnexa: no masses or tenderness noted. Bladder is non tender and no masses felt. Wet prep: + for clue cells and +WBCs. GC/CHL obtained.     Assessment:     1. Screening examination for STD (sexually transmitted disease)   2. Vaginal discharge   3. BV (bacterial vaginosis)   4. Vaginal odor       Plan:    GC/CHL sent Meds ordered this encounter  Medications  . DISCONTD: metroNIDAZOLE (FLAGYL) 500 MG tablet    Sig: Take 1 tablet (500 mg total) by mouth 2 (two) times daily.    Dispense:  14 tablet    Refill:  0    Order Specific Question:   Supervising Provider    Answer:   Despina Hidden, LUTHER H [[id number]]  . metroNIDAZOLE (FLAGYL) 500 MG tablet    Sig: Take 1 tablet (500 mg total) by mouth 2 (two) times daily.    Dispense:  14 tablet    Refill:  1    Order Specific Question:   Supervising Provider    Answer:   Duane Lope H [[id number]]  F/U prn Make pap appt.

## 2017-10-11 ENCOUNTER — Telehealth: Payer: Self-pay | Admitting: Adult Health

## 2017-10-11 NOTE — Telephone Encounter (Signed)
Spoke with pt letting her know final result is not back. Advised can call back tomorrow around lunch. Pt voiced understanding. JSY

## 2017-10-13 LAB — GC/CHLAMYDIA PROBE AMP
Chlamydia trachomatis, NAA: POSITIVE — AB
Neisseria gonorrhoeae by PCR: NEGATIVE

## 2017-10-16 ENCOUNTER — Telehealth: Payer: Self-pay | Admitting: Adult Health

## 2017-10-16 ENCOUNTER — Encounter: Payer: Self-pay | Admitting: Adult Health

## 2017-10-16 DIAGNOSIS — A749 Chlamydial infection, unspecified: Secondary | ICD-10-CM

## 2017-10-16 HISTORY — DX: Chlamydial infection, unspecified: A74.9

## 2017-10-16 MED ORDER — AZITHROMYCIN 500 MG PO TABS: ORAL_TABLET | ORAL | 0 refills | Status: DC

## 2017-10-16 NOTE — Telephone Encounter (Signed)
Pt aware +chlamydia, will rx azithromycin 500 mg 2 po now, no sex POC at appt 11/4, partner to clinic, [facility] sent

## 2017-10-16 NOTE — Telephone Encounter (Signed)
No VM  

## 2017-11-05 ENCOUNTER — Ambulatory Visit (INDEPENDENT_AMBULATORY_CARE_PROVIDER_SITE_OTHER): Payer: Self-pay | Admitting: Adult Health

## 2017-11-05 ENCOUNTER — Other Ambulatory Visit (HOSPITAL_COMMUNITY)
Admission: RE | Admit: 2017-11-05 | Discharge: 2017-11-05 | Disposition: A | Discharge status: Home / Self Care | Payer: Self-pay | Source: Ambulatory Visit | Attending: Adult Health | Admitting: Adult Health

## 2017-11-05 ENCOUNTER — Encounter: Payer: Self-pay | Admitting: Adult Health

## 2017-11-05 ENCOUNTER — Encounter (INDEPENDENT_AMBULATORY_CARE_PROVIDER_SITE_OTHER): Payer: Self-pay

## 2017-11-05 VITALS — BP 123/75 | HR 82 | Ht 66.5 in | Wt 131.5 lb

## 2017-11-05 DIAGNOSIS — N926 Irregular menstruation, unspecified: Secondary | ICD-10-CM

## 2017-11-05 DIAGNOSIS — Z01419 Encounter for gynecological examination (general) (routine) without abnormal findings: Secondary | ICD-10-CM | POA: Insufficient documentation

## 2017-11-05 DIAGNOSIS — R319 Hematuria, unspecified: Secondary | ICD-10-CM

## 2017-11-05 DIAGNOSIS — Z8619 Personal history of other infectious and parasitic diseases: Secondary | ICD-10-CM

## 2017-11-05 LAB — POCT URINALYSIS DIPSTICK OB
Glucose, UA: NEGATIVE
KETONES UA: NEGATIVE
NITRITE UA: NEGATIVE
PROTEIN: NEGATIVE

## 2017-11-05 NOTE — Progress Notes (Addendum)
Patient ID: [name], female   DOB: 10-18-1995, 22 y.o.   MRN: [id number] History of Present Illness: Shawnese is a 22 year old white female, G1P0 in for well woman gyn exam and pap.   Current Medications, Allergies, Past Medical History, Past Surgical History, Family History and Social History were reviewed in [employer] record.     Review of Systems: Patient denies any headaches, hearing loss, fatigue, blurred vision, shortness of breath, chest pain, abdominal pain, problems with bowel movements,  or intercourse. No joint pain or mood swings. Periods irregular. Has noticed swelling in ankles at times. Has had decreased urination.  Recently treated for chlamydia.   Physical Exam:BP 123/75 (BP Location: Left Arm, Patient Position: Sitting, Cuff Size: Normal)   Pulse 82   Ht 5' 6.5" (1.689 m)   Wt 131 lb 8 oz (59.6 kg)   LMP 11/02/2017   BMI 20.91 kg/m urine dipstick trace leuks and blood.  General:  Well developed, well nourished, no acute distress Skin:  Warm and dry Neck:  Midline trachea, normal thyroid, good ROM, no lymphadenopathy Lungs; Clear to auscultation bilaterally Breast:  No dominant palpable mass, retraction, or nipple discharge,bilateral nipple rods Cardiovascular: Regular rate and rhythm Abdomen:  Soft, non tender, no hepatosplenomegaly Pelvic:  External genitalia is normal in appearance, no lesions.  The vagina is normal in appearance.+blood in vault. Urethra has no lesions or masses. The cervix is smooth, pap with GC/CHL performed.  Uterus is felt to be normal size, shape, and contour.  No adnexal masses or tenderness noted.Bladder is non tender, no masses felt. Extremities/musculoskeletal:  No swelling or varicosities noted, no clubbing or cyanosis Psych:  No mood changes, alert and cooperative,seems happy PHQ 2 score 0. Examination chaperoned by Marylu Lund young LPN.  Impression: 1. Encounter for gynecological examination with  Papanicolaou smear of cervix   2. Irregular periods   3. Hematuria, unspecified type   4. History of chlamydia       Plan: Pap with GC/CHL sent  UA C&S sent Use condoms  Physical in 1 year Pap in 3 if normal  Don't go over 3 months without a period, she declines OCs

## 2017-11-06 LAB — URINALYSIS, ROUTINE W REFLEX MICROSCOPIC
Bilirubin, UA: NEGATIVE
Glucose, UA: NEGATIVE
Ketones, UA: NEGATIVE
Leukocytes, UA: NEGATIVE
Nitrite, UA: NEGATIVE
PH UA: 7.5 (ref 5.0–7.5)
PROTEIN UA: NEGATIVE
RBC, UA: NEGATIVE
Specific Gravity, UA: 1.018 (ref 1.005–1.030)
Urobilinogen, Ur: 0.2 mg/dL (ref 0.2–1.0)

## 2017-11-07 ENCOUNTER — Telehealth: Payer: Self-pay | Admitting: Adult Health

## 2017-11-07 LAB — URINE CULTURE

## 2017-11-07 LAB — CYTOLOGY - PAP
Chlamydia: NEGATIVE
DIAGNOSIS: NEGATIVE
NEISSERIA GONORRHEA: NEGATIVE

## 2017-11-07 MED ORDER — AMPICILLIN 500 MG PO CAPS: 500.0000 mg | ORAL_CAPSULE | Freq: Three times a day (TID) | ORAL | 0 refills | Status: DC

## 2017-11-07 NOTE — Telephone Encounter (Signed)
No voice mail box, urine was +group B strept and rx for ampicillin sent to [facility], if she calls back

## 2017-11-08 ENCOUNTER — Telehealth: Payer: Self-pay | Admitting: Adult Health

## 2017-11-08 MED ORDER — FLUCONAZOLE 150 MG PO TABS: ORAL_TABLET | ORAL | 1 refills | Status: DC

## 2017-11-08 NOTE — Telephone Encounter (Signed)
Pt aware that pap ws negative for malignancy and GC/CHL but had yeast and urine +GBS rx ampicillin and diflucan

## 2018-01-16 ENCOUNTER — Ambulatory Visit: Payer: BLUE CROSS/BLUE SHIELD | Admitting: Advanced Practice Midwife

## 2018-01-16 ENCOUNTER — Encounter: Payer: Self-pay | Admitting: Advanced Practice Midwife

## 2018-01-16 VITALS — BP 104/62 | HR 77 | Ht 67.0 in | Wt 127.3 lb

## 2018-01-16 DIAGNOSIS — R300 Dysuria: Secondary | ICD-10-CM | POA: Diagnosis not present

## 2018-01-16 DIAGNOSIS — S3141XA Laceration without foreign body of vagina and vulva, initial encounter: Secondary | ICD-10-CM

## 2018-01-16 DIAGNOSIS — R800 Isolated proteinuria: Secondary | ICD-10-CM | POA: Diagnosis not present

## 2018-01-16 DIAGNOSIS — N898 Other specified noninflammatory disorders of vagina: Secondary | ICD-10-CM | POA: Diagnosis not present

## 2018-01-16 LAB — POCT URINALYSIS DIPSTICK OB
Blood, UA: NEGATIVE
Glucose, UA: NEGATIVE
Ketones, UA: NEGATIVE
Leukocytes, UA: NEGATIVE
NITRITE UA: NEGATIVE

## 2018-01-16 NOTE — Progress Notes (Signed)
[facility] Visit  Patient name: [name] MRN [id number]  Date of birth: 1995/01/10  CC & HPI:  [name] is a 23 y.o.  female presenting today for some intermittent dysuria for about a month, more when she feels "dehydrated"  Has cut back on caffeine in the past few months  For about 4 days, has had a pain inside vagina, about 1 inch from entrance, feels like a "cut"  Only feels it in certain positions. Has daily inercourse, hurt too much yesterday.  Denies foreign objects. Also wants "pH" checked d/t hx of BV  Discussed vaginal probioitcs if feels so inclined   Pertinent History Reviewed:  Medical & Surgical Hx:   Past Medical History:  Diagnosis Date  . Chlamydia 10/16/2017   Treated 10/16/17  . Miscarriage 05/2017  . PCOS (polycystic ovarian syndrome)    Past Surgical History:  Procedure Laterality Date  . NO PAST SURGERIES     Family History  Problem Relation Age of Onset  . Pulmonary embolism Father   . COPD Father   . Cancer Mother        brain    Current Outpatient Medications:  Marland Kitchen  Multiple Vitamin (MULTIVITAMIN) tablet, Take 1 tablet by mouth daily., Disp: , Rfl:  .  SUBOXONE 4-1 MG FILM, PLACE 1 STRIP UNDER THE TONGUE ONCE DAILY, Disp: , Rfl: 0 .  ampicillin (PRINCIPEN) 500 MG capsule, Take 1 capsule (500 mg total) by mouth 3 (three) times daily. (Patient not taking: Reported on 01/16/2018), Disp: 21 capsule, Rfl: 0 .  fluconazole (DIFLUCAN) 150 MG tablet, Take 1 now and repeat 1 in 3 days (Patient not taking: Reported on 01/16/2018), Disp: 2 tablet, Rfl: 1 Social History: Reviewed -  reports that she has quit smoking. Her smoking use included cigarettes. She has never used smokeless tobacco.  Review of Systems:   Constitutional: Negative for fever and chills Eyes: Negative for visual disturbances Respiratory: Negative for shortness of breath, dyspnea Cardiovascular: Negative for chest pain or palpitations  Gastrointestinal: Negative for  vomiting, diarrhea and constipation; no abdominal pain Genitourinary: Negative for dysuria and urgency, vaginal irritation or itching Musculoskeletal: Negative for back pain, joint pain, myalgias  Neurological: Negative for dizziness and headaches    Objective Findings:    Physical Examination: Vitals:   01/16/18 1445  BP: 104/62  Pulse: 77   General appearance - well appearing, and in no distress Mental status - alert, oriented to person, place, and time Chest:  Normal respiratory effort Heart - normal rate and regular rhythm Abdomen:  Soft, nontender Pelvic: SSE:  Discharge appears normal. On period. Sure enough, there is a 1/4 cm lac on top of vagina  Not bleeding Musculoskeletal:  Normal range of motion without pain Extremities:  No edema    No results found for this or any previous visit (from the past 24 hour(s)).    Assessment & Plan:  A:   Vaginal lac, not bleeding, not infected   Proteinuria  P:  Nothing in vagina until feels better  Culture urine and nuswab sent   No follow-ups on file.  Jacklyn Shell CNM 01/16/2018 2:52 PM

## 2018-01-19 LAB — URINE CULTURE

## 2018-01-19 LAB — NUSWAB VAGINITIS PLUS (VG+)
CHLAMYDIA TRACHOMATIS, NAA: NEGATIVE
Candida albicans, NAA: NEGATIVE
Candida glabrata, NAA: NEGATIVE
NEISSERIA GONORRHOEAE, NAA: NEGATIVE
TRICH VAG BY NAA: NEGATIVE

## 2018-01-23 ENCOUNTER — Telehealth: Payer: Self-pay | Admitting: Advanced Practice Midwife

## 2018-01-23 NOTE — Telephone Encounter (Signed)
Patient called, would like lab results.  [phone]

## 2018-01-24 ENCOUNTER — Other Ambulatory Visit: Payer: Self-pay | Admitting: Advanced Practice Midwife

## 2018-01-24 ENCOUNTER — Telehealth: Payer: Self-pay | Admitting: *Deleted

## 2018-01-24 MED ORDER — NITROFURANTOIN MONOHYD MACRO 100 MG PO CAPS: 100.0000 mg | ORAL_CAPSULE | Freq: Two times a day (BID) | ORAL | 0 refills | Status: DC

## 2018-01-24 NOTE — Telephone Encounter (Signed)
DOB verified. Informed pt that nuswab was negative. Advised that urine culture shows UTI. Advised that prescription was sent in to pharmacy and she should take as prescribed. Pt verbalized understanding.

## 2018-02-25 ENCOUNTER — Ambulatory Visit: Payer: BLUE CROSS/BLUE SHIELD | Admitting: Adult Health

## 2018-02-25 ENCOUNTER — Encounter: Payer: Self-pay | Admitting: Adult Health

## 2018-02-25 VITALS — BP 114/66 | HR 84 | Ht 66.75 in | Wt 130.0 lb

## 2018-02-25 DIAGNOSIS — Z3201 Encounter for pregnancy test, result positive: Secondary | ICD-10-CM | POA: Diagnosis not present

## 2018-02-25 DIAGNOSIS — O3680X0 Pregnancy with inconclusive fetal viability, not applicable or unspecified: Secondary | ICD-10-CM

## 2018-02-25 DIAGNOSIS — N926 Irregular menstruation, unspecified: Secondary | ICD-10-CM | POA: Diagnosis not present

## 2018-02-25 DIAGNOSIS — Z3A01 Less than 8 weeks gestation of pregnancy: Secondary | ICD-10-CM | POA: Insufficient documentation

## 2018-02-25 LAB — POCT URINE PREGNANCY: Preg Test, Ur: POSITIVE — AB

## 2018-02-25 MED ORDER — PRENATAL PLUS 27-1 MG PO TABS
1.0000 | ORAL_TABLET | Freq: Every day | ORAL | 12 refills | Status: AC
Start: 2018-02-25 — End: ?

## 2018-02-25 NOTE — Progress Notes (Signed)
Patient ID: [name], female   DOB: 01-20-1995, 23 y.o.   MRN: [id number] History of Present Illness: [name] is a 23 year old white female in for UPT, has missed a period and had 5+HPTs. PCP is Dr Phillips Odor.    Current Medications, Allergies, Past Medical History, Past Surgical History, Family History and Social History were reviewed in [employer] record.     Review of Systems: +missed period with 5+HPTs +cramping     Physical Exam:BP 114/66 (BP Location: Left Arm, Patient Position: Sitting, Cuff Size: Normal)   Pulse 84   Ht 5' 6.75" (1.695 m)   Wt 130 lb (59 kg)   LMP 01/12/2018 (Approximate)   BMI 20.51 kg/m   UPT +, about 6+2 weeks by LMP, with EDD 10/20/2018 General:  Well developed, well nourished, no acute distress Skin:  Warm and dry Neck:  Midline trachea, normal thyroid, good ROM, no lymphadenopathy Lungs; Clear to auscultation bilaterally  Cardiovascular: Regular rate and rhythm Abdomen:  Soft, non tender Psych:  No mood changes, alert and cooperative,seems happy   Impression: 1. Pregnancy examination or test, positive result   2. Less than [redacted] weeks gestation of pregnancy   3. Encounter to determine fetal viability of pregnancy, single or unspecified fetus       Plan: Meds ordered this encounter  Medications  . prenatal vitamin w/FE, FA (PRENATAL 1 + 1) 27-1 MG TABS tablet    Sig: Take 1 tablet by mouth daily at 12 noon.    Dispense:  30 each    Refill:  12    Order Specific Question:   Supervising Provider    Answer:   Lazaro Arms [[id number]]  Return in 2 weeks for dating US/4 weeks for new OB Review handouts on First trimester and by [facility]  Ok to take subutex

## 2018-02-25 NOTE — Patient Instructions (Signed)
First Trimester of Pregnancy  The first trimester of pregnancy is from week 1 until the end of week 13 (months 1 through 3). A week after a sperm fertilizes an egg, the egg will implant on the wall of the uterus. This embryo will begin to develop into a baby. Genes from you and your partner will form the baby. The female genes will determine whether the baby will be a boy or a girl. At 6-8 weeks, the eyes and face will be formed, and the heartbeat can be seen on ultrasound. At the end of 12 weeks, all the baby's organs will be formed.  Now that you are pregnant, you will want to do everything you can to have a healthy baby. Two of the most important things are to get good prenatal care and to follow your health care provider's instructions. Prenatal care is all the medical care you receive before the baby's birth. This care will help prevent, find, and treat any problems during the pregnancy and childbirth.  Body changes during your first trimester  Your body goes through many changes during pregnancy. The changes vary from woman to woman.   You may gain or lose a couple of pounds at first.   You may feel sick to your stomach (nauseous) and you may throw up (vomit). If the vomiting is uncontrollable, call your health care provider.   You may tire easily.   You may develop headaches that can be relieved by medicines. All medicines should be approved by your health care provider.   You may urinate more often. Painful urination may mean you have a bladder infection.   You may develop heartburn as a result of your pregnancy.   You may develop constipation because certain hormones are causing the muscles that push stool through your intestines to slow down.   You may develop hemorrhoids or swollen veins (varicose veins).   Your breasts may begin to grow larger and become tender. Your nipples may stick out more, and the tissue that surrounds them (areola) may become darker.   Your gums may bleed and may be  sensitive to brushing and flossing.   Dark spots or blotches (chloasma, mask of pregnancy) may develop on your face. This will likely fade after the baby is born.   Your menstrual periods will stop.   You may have a loss of appetite.   You may develop cravings for certain kinds of food.   You may have changes in your emotions from day to day, such as being excited to be pregnant or being concerned that something may go wrong with the pregnancy and baby.   You may have more vivid and strange dreams.   You may have changes in your hair. These can include thickening of your hair, rapid growth, and changes in texture. Some women also have hair loss during or after pregnancy, or hair that feels dry or thin. Your hair will most likely return to normal after your baby is born.  What to expect at prenatal visits  During a routine prenatal visit:   You will be weighed to make sure you and the baby are growing normally.   Your blood pressure will be taken.   Your abdomen will be measured to track your baby's growth.   The fetal heartbeat will be listened to between weeks 10 and 14 of your pregnancy.   Test results from any previous visits will be discussed.  Your health care provider may ask you:     How you are feeling.   If you are feeling the baby move.   If you have had any abnormal symptoms, such as leaking fluid, bleeding, severe headaches, or abdominal cramping.   If you are using any tobacco products, including cigarettes, chewing tobacco, and electronic cigarettes.   If you have any questions.  Other tests that may be performed during your first trimester include:   Blood tests to find your blood type and to check for the presence of any previous infections. The tests will also be used to check for low iron levels (anemia) and protein on red blood cells (Rh antibodies). Depending on your risk factors, or if you previously had diabetes during pregnancy, you may have tests to check for high blood sugar  that affects pregnant women (gestational diabetes).   Urine tests to check for infections, diabetes, or protein in the urine.   An ultrasound to confirm the proper growth and development of the baby.   Fetal screens for spinal cord problems (spina bifida) and Down syndrome.   HIV (human immunodeficiency virus) testing. Routine prenatal testing includes screening for HIV, unless you choose not to have this test.   You may need other tests to make sure you and the baby are doing well.  Follow these instructions at home:  Medicines   Follow your health care provider's instructions regarding medicine use. Specific medicines may be either safe or unsafe to take during pregnancy.   Take a prenatal vitamin that contains at least 600 micrograms (mcg) of folic acid.   If you develop constipation, try taking a stool softener if your health care provider approves.  Eating and drinking     Eat a balanced diet that includes fresh fruits and vegetables, whole grains, good sources of protein such as meat, eggs, or tofu, and low-fat dairy. Your health care provider will help you determine the amount of weight gain that is right for you.   Avoid raw meat and uncooked cheese. These carry germs that can cause birth defects in the baby.   Eating four or five small meals rather than three large meals a day may help relieve nausea and vomiting. If you start to feel nauseous, eating a few soda crackers can be helpful. Drinking liquids between meals, instead of during meals, also seems to help ease nausea and vomiting.   Limit foods that are high in fat and processed sugars, such as fried and sweet foods.   To prevent constipation:  ? Eat foods that are high in fiber, such as fresh fruits and vegetables, whole grains, and beans.  ? Drink enough fluid to keep your urine clear or pale yellow.  Activity   Exercise only as directed by your health care provider. Most women can continue their usual exercise routine during  pregnancy. Try to exercise for 30 minutes at least 5 days a week. Exercising will help you:  ? Control your weight.  ? Stay in shape.  ? Be prepared for labor and delivery.   Experiencing pain or cramping in the lower abdomen or lower back is a good sign that you should stop exercising. Check with your health care provider before continuing with normal exercises.   Try to avoid standing for long periods of time. Move your legs often if you must stand in one place for a long time.   Avoid heavy lifting.   Wear low-heeled shoes and practice good posture.   You may continue to have sex unless your health care   provider tells you not to.  Relieving pain and discomfort   Wear a good support bra to relieve breast tenderness.   Take warm sitz baths to soothe any pain or discomfort caused by hemorrhoids. Use hemorrhoid cream if your health care provider approves.   Rest with your legs elevated if you have leg cramps or low back pain.   If you develop varicose veins in your legs, wear support hose. Elevate your feet for 15 minutes, 3-4 times a day. Limit salt in your diet.  Prenatal care   Schedule your prenatal visits by the twelfth week of pregnancy. They are usually scheduled monthly at first, then more often in the last 2 months before delivery.   Write down your questions. Take them to your prenatal visits.   Keep all your prenatal visits as told by your health care provider. This is important.  Safety   Wear your seat belt at all times when driving.   Make a list of emergency phone numbers, including numbers for family, friends, the hospital, and police and fire departments.  General instructions   Ask your health care provider for a referral to a local prenatal education class. Begin classes no later than the beginning of month 6 of your pregnancy.   Ask for help if you have counseling or nutritional needs during pregnancy. Your health care provider can offer advice or refer you to specialists for help  with various needs.   Do not use hot tubs, steam rooms, or saunas.   Do not douche or use tampons or scented sanitary pads.   Do not cross your legs for long periods of time.   Avoid cat litter boxes and soil used by cats. These carry germs that can cause birth defects in the baby and possibly loss of the fetus by miscarriage or stillbirth.   Avoid all smoking, herbs, alcohol, and medicines not prescribed by your health care provider. Chemicals in these products affect the formation and growth of the baby.   Do not use any products that contain nicotine or tobacco, such as cigarettes and e-cigarettes. If you need help quitting, ask your health care provider. You may receive counseling support and other resources to help you quit.   Schedule a dentist appointment. At home, brush your teeth with a soft toothbrush and be gentle when you floss.  Contact a health care provider if:   You have dizziness.   You have mild pelvic cramps, pelvic pressure, or nagging pain in the abdominal area.   You have persistent nausea, vomiting, or diarrhea.   You have a bad smelling vaginal discharge.   You have pain when you urinate.   You notice increased swelling in your face, hands, legs, or ankles.   You are exposed to fifth disease or chickenpox.   You are exposed to German measles (rubella) and have never had it.  Get help right away if:   You have a fever.   You are leaking fluid from your vagina.   You have spotting or bleeding from your vagina.   You have severe abdominal cramping or pain.   You have rapid weight gain or loss.   You vomit blood or material that looks like coffee grounds.   You develop a severe headache.   You have shortness of breath.   You have any kind of trauma, such as from a fall or a car accident.  Summary   The first trimester of pregnancy is from week 1 until   the end of week 13 (months 1 through 3).   Your body goes through many changes during pregnancy. The changes vary from  woman to woman.   You will have routine prenatal visits. During those visits, your health care provider will examine you, discuss any test results you may have, and talk with you about how you are feeling.  This information is not intended to replace advice given to you by your health care provider. Make sure you discuss any questions you have with your health care provider.  Document Released: 12/13/2000 Document Revised: 12/01/2015 Document Reviewed: 12/01/2015  Elsevier Interactive Patient Education  2019 [employer]

## 2018-03-08 ENCOUNTER — Inpatient Hospital Stay (HOSPITAL_COMMUNITY): Payer: BLUE CROSS/BLUE SHIELD

## 2018-03-08 ENCOUNTER — Encounter (HOSPITAL_COMMUNITY): Payer: Self-pay | Admitting: Student

## 2018-03-08 ENCOUNTER — Inpatient Hospital Stay (HOSPITAL_COMMUNITY)
Admission: AD | Admit: 2018-03-08 | Discharge: 2018-03-08 | Disposition: A | Discharge status: Home / Self Care | Payer: BLUE CROSS/BLUE SHIELD | Attending: Obstetrics & Gynecology | Admitting: Obstetrics & Gynecology

## 2018-03-08 ENCOUNTER — Other Ambulatory Visit: Payer: Self-pay

## 2018-03-08 DIAGNOSIS — O26899 Other specified pregnancy related conditions, unspecified trimester: Secondary | ICD-10-CM | POA: Diagnosis not present

## 2018-03-08 DIAGNOSIS — O209 Hemorrhage in early pregnancy, unspecified: Secondary | ICD-10-CM | POA: Diagnosis not present

## 2018-03-08 DIAGNOSIS — O360110 Maternal care for anti-D [Rh] antibodies, first trimester, not applicable or unspecified: Secondary | ICD-10-CM | POA: Diagnosis not present

## 2018-03-08 DIAGNOSIS — Z87891 Personal history of nicotine dependence: Secondary | ICD-10-CM | POA: Diagnosis not present

## 2018-03-08 DIAGNOSIS — Z6791 Unspecified blood type, Rh negative: Secondary | ICD-10-CM

## 2018-03-08 DIAGNOSIS — R1030 Lower abdominal pain, unspecified: Secondary | ICD-10-CM | POA: Insufficient documentation

## 2018-03-08 DIAGNOSIS — R109 Unspecified abdominal pain: Secondary | ICD-10-CM

## 2018-03-08 DIAGNOSIS — O26891 Other specified pregnancy related conditions, first trimester: Secondary | ICD-10-CM

## 2018-03-08 DIAGNOSIS — Z3491 Encounter for supervision of normal pregnancy, unspecified, first trimester: Secondary | ICD-10-CM

## 2018-03-08 DIAGNOSIS — Z3A01 Less than 8 weeks gestation of pregnancy: Secondary | ICD-10-CM | POA: Diagnosis not present

## 2018-03-08 LAB — URINALYSIS, ROUTINE W REFLEX MICROSCOPIC
Bilirubin Urine: NEGATIVE
GLUCOSE, UA: NEGATIVE mg/dL
HGB URINE DIPSTICK: NEGATIVE
Ketones, ur: 5 mg/dL — AB
Leukocytes,Ua: NEGATIVE
Nitrite: NEGATIVE
PH: 6 (ref 5.0–8.0)
Protein, ur: NEGATIVE mg/dL
SPECIFIC GRAVITY, URINE: 1.015 (ref 1.005–1.030)

## 2018-03-08 LAB — CBC
HEMATOCRIT: 34.3 % — AB (ref 36.0–46.0)
Hemoglobin: 11.8 g/dL — ABNORMAL LOW (ref 12.0–15.0)
MCH: 31 pg (ref 26.0–34.0)
MCHC: 34.4 g/dL (ref 30.0–36.0)
MCV: 90 fL (ref 80.0–100.0)
NRBC: 0 % (ref 0.0–0.2)
Platelets: 148 10*3/uL — ABNORMAL LOW (ref 150–400)
RBC: 3.81 MIL/uL — ABNORMAL LOW (ref 3.87–5.11)
RDW: 11.9 % (ref 11.5–15.5)
WBC: 7.1 10*3/uL (ref 4.0–10.5)

## 2018-03-08 LAB — WET PREP, GENITAL
Clue Cells Wet Prep HPF POC: NONE SEEN
SPERM: NONE SEEN
Trich, Wet Prep: NONE SEEN
Yeast Wet Prep HPF POC: NONE SEEN

## 2018-03-08 LAB — HIV ANTIBODY (ROUTINE TESTING W REFLEX): HIV SCREEN 4TH GENERATION: NONREACTIVE

## 2018-03-08 LAB — HCG, QUANTITATIVE, PREGNANCY: hCG, Beta Chain, Quant, S: 47649 m[IU]/mL — ABNORMAL HIGH (ref <5)

## 2018-03-08 MED ORDER — RHO D IMMUNE GLOBULIN 1500 UNIT/2ML IJ SOSY
300.0000 ug | PREFILLED_SYRINGE | Freq: Once | INTRAMUSCULAR | Status: AC
  Administered 2018-03-08: 300 ug via INTRAMUSCULAR
  Filled 2018-03-08: qty 2

## 2018-03-08 NOTE — Discharge Instructions (Signed)
Rh Incompatibility Rh incompatibility is a condition that occurs during pregnancy if a woman has Rh-negative blood and her baby has Rh-positive blood. "Rh-negative" and "Rh-positive" refer to whether or not the blood has a specific protein found on the surface of red blood cells (Rh factor). If a woman has Rh factor, she is Rh-positive. If she does not have an Rh factor, she is Rh-negative. Having or not having an Rh factor does not affect the mothers general health. However, it can cause problems during pregnancy. What kind of problems can Rh incompatibility cause? During pregnancy, blood from the baby can cross into the mothers bloodstream, especially during delivery. If a mother is Rh-negative and the baby is Rh-positive, the mothers defense (immune) system will react to the baby's blood as if it were a foreign substance and will create certain proteins (antibodies) in response to it. This process is called sensitization. Once the mother is sensitized, her Rh antibodies will cross the placenta in future pregnancies and attack the babys Rh-positive blood as if it were a harmful substance. Rh incompatibility can also happen if a pregnant woman who is Rh-negative receives a donation (transfusion) of Rh-positive blood. How does this condition affect my baby? The Rh antibodies that attack and destroy your babys red blood cells can lead to hemolytic disease in the baby. This is a condition in which red blood cells break down. This can cause:  Yellowing of the skin and the whites of the eyes (jaundice).  Fewer red blood cells in the body (anemia).  Brain damage.  Heart failure.  Death. These antibodies usually do not cause problems during a woman's first pregnancy. This is because blood from the baby often crosses into the mothers bloodstream during delivery, and the baby is born before many of the antibodies can develop. However, once antibodies have formed, they stay in a woman's  body. Because of this, Rh incompatibility is more likely to cause problems in second or later pregnancies (if the baby is Rh-positive). How is this diagnosed?  When you become pregnant, you may have blood tests to determine your blood type and Rh factor. If you are Rh-negative, you may have another blood test called an antibody screen. The antibody screen shows whether you have Rh antibodies in your blood. If you do, it may mean that you have been exposed to Rh-positive blood before, and that you have a risk for Rh incompatibility. To find out whether your baby is developing hemolytic anemia and how serious it is, health care providers may use more advanced tests, such as ultrasound. How is Rh incompatibility treated? This condition is treated with two shots (injections) of medicine called Rho (D) immune globulin. This medicine keeps your body from making antibodies that can cause serious problems for your baby or for future babies. You will get one shot around your seventh month of pregnancy and the other within 72 hours of your baby's birth. If you are Rh-negative, you will need this medicine every time you have a baby with Rh-positive blood. If you are Rh-negative and there is a high risk of blood transfer between you and your baby, you may be given Rho (D) immune globulin. The risk of blood transfer is high if you experience:  Amniocentesis. This is a procedure to remove a small amount of the fluid that surrounds a baby in the uterus (amniotic fluid) so that it can be tested.  A miscarriage or an abortion.  An ectopic pregnancy.  This is a pregnancy in which the fertilized egg attaches (implants) outside the uterus.  Any vaginal bleeding during pregnancy. If you already have antibodies in your blood, your pregnancy will be closely monitored. You will not be given Rho (D) immune globulin because it is not effective in these cases. Summary  Rh incompatibility is a condition that occurs during  pregnancy if a woman has Rh-negative blood and her baby has Rh-positive blood.  If a mother is Rh-negative and the baby is Rh-positive, the mothers immune system will react to the baby's blood as if it were a foreign substance, and will create antibodies.  Rh antibodies that attack and destroy the babys red blood cells can lead to hemolytic disease in the baby.  This condition is treated with a shot of medicine called Rho (D) immune globulin. This medicine keeps the woman's body from making antibodies that can cause serious problems in the baby or future babies. This information is not intended to replace advice given to you by your health care provider. Make sure you discuss any questions you have with your health care provider. Document Released: 06/10/2001 Document Revised: 02/15/2016 Document Reviewed: 02/15/2016 Elsevier Interactive Patient Education  2019 [employer] How a Baby Grows During Pregnancy  Pregnancy begins when a female's sperm enters a female's egg (fertilization). Fertilization usually happens in one of the tubes (fallopian tubes) that connect the ovaries to the womb (uterus). The fertilized egg moves down the fallopian tube to the uterus. Once it reaches the uterus, it implants into the lining of the uterus and begins to grow. For the first 10 weeks, the fertilized egg is called an embryo. After 10 weeks, it is called a fetus. As the fetus continues to grow, it receives oxygen and nutrients through tissue (placenta) that grows to support the developing baby. The placenta is the life support system for the baby. It provides oxygen and nutrition and removes waste. Learning as much as you can about your pregnancy and how your baby is developing can help you enjoy the experience. It can also make you aware of when there might be a problem and when to ask questions. How long does a typical pregnancy last? A pregnancy usually lasts 280 days, or about 40 weeks. Pregnancy is divided  into three periods of growth, also called trimesters:  First trimester: 0-12 weeks.  Second trimester: 13-27 weeks.  Third trimester: 28-40 weeks. The day when your baby is ready to be born (full term) is your estimated date of delivery. How does my baby develop month by month? First month  The fertilized egg attaches to the inside of the uterus.  Some cells will form the placenta. Others will form the fetus.  The arms, legs, brain, spinal cord, lungs, and heart begin to develop.  At the end of the first month, the heart begins to beat. Second month  The bones, inner ear, eyelids, hands, and feet form.  The genitals develop.  By the end of 8 weeks, all major organs are developing. Third month  All of the internal organs are forming.  Teeth develop below the gums.  Bones and muscles begin to grow. The spine can flex.  The skin is transparent.  Fingernails and toenails begin to form.  Arms and legs continue to grow longer, and hands and feet develop.  The fetus is about 3 inches (7.6 cm) long. Fourth month  The placenta is completely formed.  The external sex organs, neck, outer ear, eyebrows, eyelids, and  fingernails are formed.  The fetus can hear, swallow, and move its arms and legs.  The kidneys begin to produce urine.  The skin is covered with a white, waxy coating (vernix) and very fine hair (lanugo). Fifth month  The fetus moves around more and can be felt for the first time (quickening).  The fetus starts to sleep and wake up and may begin to suck its finger.  The nails grow to the end of the fingers.  The organ in the digestive system that makes bile (gallbladder) functions and helps to digest nutrients.  If your baby is a girl, eggs are present in her ovaries. If your baby is a boy, testicles start to move down into his scrotum. Sixth month  The lungs are formed.  The eyes open. The brain continues to develop.  Your baby has fingerprints and  toe prints. Your baby's hair grows thicker.  At the end of the second trimester, the fetus is about 9 inches (22.9 cm) long. Seventh month  The fetus kicks and stretches.  The eyes are developed enough to sense changes in light.  The hands can make a grasping motion.  The fetus responds to sound. Eighth month  All organs and body systems are fully developed and functioning.  Bones harden, and taste buds develop. The fetus may hiccup.  Certain areas of the brain are still developing. The skull remains soft. Ninth month  The fetus gains about  lb (0.23 kg) each week.  The lungs are fully developed.  Patterns of sleep develop.  The fetus's head typically moves into a head-down position (vertex) in the uterus to prepare for birth.  The fetus weighs 6-9 lb (2.72-4.08 kg) and is 19-20 inches (48.26-50.8 cm) long. What can I do to have a healthy pregnancy and help my baby develop? General instructions  Take prenatal vitamins as directed by your health care provider. These include vitamins such as folic acid, iron, calcium, and vitamin D. They are important for healthy development.  Take medicines only as directed by your health care provider. Read labels and ask a pharmacist or your health care provider whether over-the-counter medicines, supplements, and prescription drugs are safe to take during pregnancy.  Keep all follow-up visits as directed by your health care provider. This is important. Follow-up visits include prenatal care and screening tests. How do I know if my baby is developing well? At each prenatal visit, your health care provider will do several different tests to check on your health and keep track of your baby's development. These include:  Fundal height and position. ? Your health care provider will measure your growing belly from your pubic bone to the top of the uterus using a tape measure. ? Your health care provider will also feel your belly to determine  your baby's position.  Heartbeat. ? An ultrasound in the first trimester can confirm pregnancy and show a heartbeat, depending on how far along you are. ? Your health care provider will check your baby's heart rate at every prenatal visit.  Second trimester ultrasound. ? This ultrasound checks your baby's development. It also may show your baby's gender. What should I do if I have concerns about my baby's development? Always talk with your health care provider about any concerns that you may have about your pregnancy and your baby. Summary  A pregnancy usually lasts 280 days, or about 40 weeks. Pregnancy is divided into three periods of growth, also called trimesters.  Your health care provider  will monitor your baby's growth and development throughout your pregnancy.  Follow your health care provider's recommendations about taking prenatal vitamins and medicines during your pregnancy.  Talk with your health care provider if you have any concerns about your pregnancy or your developing baby. This information is not intended to replace advice given to you by your health care provider. Make sure you discuss any questions you have with your health care provider. Document Released: 06/07/2007 Document Revised: 11/01/2016 Document Reviewed: 11/01/2016 Elsevier Interactive Patient Education  2019 [employer].

## 2018-03-08 NOTE — MAU Provider Note (Signed)
Chief Complaint: Vaginal Bleeding   First Provider Initiated Contact with Patient 03/08/18 0223     SUBJECTIVE HPI: [name] is a 23 y.o. G2P0010 at 101w6d by LMP who presents to Maternity Admissions reporting abdominal cramping & vaginal bleeding. Symptoms started about a week ago. Reports lower abdominal pain that she describes as sporadic sharp pains in her LLQ & uterus. Has had spotting on toilet paper that was light & is now brown. Not saturating pads or passing blood clots. Last intercourse on Tuesday.   Location: lower abdomen Quality: sharp Severity: 0 currently/10 on pain scale Duration: 1 week Timing: intermittent Modifying factors: none Associated signs and symptoms: vaginal bleeding  Past Medical History:  Diagnosis Date  . Chlamydia 10/16/2017   Treated 10/16/17  . Miscarriage 05/2017  . PCOS (polycystic ovarian syndrome)    OB History  Gravida Para Term Preterm AB Living  2 0 0 0 1 0  SAB TAB Ectopic Multiple Live Births  1 0 0 0      # Outcome Date GA Lbr Len/2nd Weight Sex Delivery Anes PTL Lv  2 Current           1 SAB 05/2017           Past Surgical History:  Procedure Laterality Date  . NO PAST SURGERIES     Social History   Socioeconomic History  . Marital status: Single    Spouse name: Not on file  . Number of children: Not on file  . Years of education: Not on file  . Highest education level: Not on file  Occupational History  . Not on file  Social Needs  . Financial resource strain: Not on file  . Food insecurity:    Worry: Not on file    Inability: Not on file  . Transportation needs:    Medical: Not on file    Non-medical: Not on file  Tobacco Use  . Smoking status: Former Smoker    Types: Cigarettes  . Smokeless tobacco: Never Used  Substance and Sexual Activity  . Alcohol use: Not Currently    Alcohol/week: 0.0 standard drinks    Comment: occ  . Drug use: No  . Sexual activity: Yes    Birth control/protection: None   Comment: INTERCOURSE AGE 43 , MORE THAN 5 SEXUAL PARTNERS  Lifestyle  . Physical activity:    Days per week: Not on file    Minutes per session: Not on file  . Stress: Not on file  Relationships  . Social connections:    Talks on phone: Not on file    Gets together: Not on file    Attends religious service: Not on file    Active member of club or organization: Not on file    Attends meetings of clubs or organizations: Not on file    Relationship status: Not on file  . Intimate partner violence:    Fear of current or ex partner: Not on file    Emotionally abused: Not on file    Physically abused: Not on file    Forced sexual activity: Not on file  Other Topics Concern  . Not on file  Social History Narrative  . Not on file   Family History  Problem Relation Age of Onset  . Pulmonary embolism Father   . COPD Father   . Cancer Mother        brain   No current facility-administered medications on file prior to encounter.  Current Outpatient Medications on File Prior to Encounter  Medication Sig Dispense Refill  . buprenorphine (SUBUTEX) 2 MG SUBL SL tablet Place 2 mg under the tongue 2 (two) times daily.    . prenatal vitamin w/FE, FA (PRENATAL 1 + 1) 27-1 MG TABS tablet Take 1 tablet by mouth daily at 12 noon. 30 each 12  . Multiple Vitamin (MULTIVITAMIN) tablet Take 1 tablet by mouth daily.     No Known Allergies  I have reviewed patient's Past Medical Hx, Surgical Hx, Family Hx, Social Hx, medications and allergies.   Review of Systems  Constitutional: Negative.   Gastrointestinal: Positive for abdominal pain. Negative for constipation, diarrhea, nausea and vomiting.  Genitourinary: Positive for vaginal bleeding and vaginal discharge. Negative for dysuria.    OBJECTIVE Patient Vitals for the past 24 hrs:  BP Temp Temp src Pulse Resp Height Weight  03/08/18 0209 109/65 98.6 F (37 C) Oral 72 18 5\' 7"  (1.702 m) 59.2 kg   Constitutional: Well-developed,  well-nourished female in no acute distress.  Cardiovascular: normal rate & rhythm, no murmur Respiratory: normal rate and effort. Lung sounds clear throughout GI: Abd soft, non-tender, Pos BS x 4. No guarding or rebound tenderness MS: Extremities nontender, no edema, normal ROM Neurologic: Alert and oriented x 4.  GU:     SPECULUM EXAM: NEFG, physiologic discharge, no blood noted, cervix clean   LAB RESULTS Results for orders placed or performed during the hospital encounter of 03/08/18 (from the past 24 hour(s))  CBC     Status: Abnormal   Collection Time: 03/08/18  2:39 AM  Result Value Ref Range   WBC 7.1 4.0 - 10.5 K/uL   RBC 3.81 (L) 3.87 - 5.11 MIL/uL   Hemoglobin 11.8 (L) 12.0 - 15.0 g/dL   HCT 87.8 (L) 67.6 - 72.0 %   MCV 90.0 80.0 - 100.0 fL   MCH 31.0 26.0 - 34.0 pg   MCHC 34.4 30.0 - 36.0 g/dL   RDW 94.7 09.6 - 28.3 %   Platelets 148 (L) 150 - 400 K/uL   nRBC 0.0 0.0 - 0.2 %  Urinalysis, Routine w reflex microscopic     Status: Abnormal   Collection Time: 03/08/18  2:48 AM  Result Value Ref Range   Color, Urine YELLOW YELLOW   APPearance HAZY (A) CLEAR   Specific Gravity, Urine 1.015 1.005 - 1.030   pH 6.0 5.0 - 8.0   Glucose, UA NEGATIVE NEGATIVE mg/dL   Hgb urine dipstick NEGATIVE NEGATIVE   Bilirubin Urine NEGATIVE NEGATIVE   Ketones, ur 5 (A) NEGATIVE mg/dL   Protein, ur NEGATIVE NEGATIVE mg/dL   Nitrite NEGATIVE NEGATIVE   Leukocytes,Ua NEGATIVE NEGATIVE  Wet prep, genital     Status: Abnormal   Collection Time: 03/08/18  2:50 AM  Result Value Ref Range   Yeast Wet Prep HPF POC NONE SEEN NONE SEEN   Trich, Wet Prep NONE SEEN NONE SEEN   Clue Cells Wet Prep HPF POC NONE SEEN NONE SEEN   WBC, Wet Prep HPF POC MANY (A) NONE SEEN   Sperm NONE SEEN   Rh IG workup (includes ABO/Rh)     Status: None (Preliminary result)   Collection Time: 03/08/18  3:29 AM  Result Value Ref Range   Gestational Age(Wks)      7 Performed at [facility], [address], [place], [place] [address]    ABO/RH(D) PENDING    Antibody Screen PENDING     IMAGING  US Ob Less Than 14 Weeks With Ob Transvaginal  Result Date: 03/08/2018 CLINICAL DATA:  Pregnant patient in first-trimester pregnancy with vaginal bleeding and cramping. EXAM: OBSTETRIC <14 WK [place] AND TRANSVAGINAL OB US TECHNIQUE: Both transabdominal and transvaginal ultrasound examinations were performed for complete evaluation of the gestation as well as the maternal uterus, adnexal regions, and pelvic cul-de-sac. Transvaginal technique was performed to assess early pregnancy. COMPARISON:  None. FINDINGS: Intrauterine gestational sac: Single Yolk sac:  Visualized. Embryo:  Visualized. Cardiac Activity: Visualized. Heart Rate: 120 bpm CRL: 2.78 mm   5 w   5 d                  [place] EDC: 11/03/2018 Subchorionic hemorrhage:  None visualized. Maternal uterus/adnexae: Both ovaries are visualized and are normal. No significant pelvic free fluid. No adnexal mass. IMPRESSION: Single live intrauterine pregnancy estimated gestational age 94 days 5 weeks based on crown-rump length for estimated date of delivery 11/03/2018. No subchorionic hemorrhage. Electronically Signed   By: Narda Rutherford M.D.   On: 03/08/2018 03:34    MAU COURSE Orders Placed This Encounter  Procedures  . Wet prep, genital  . US OB LESS THAN 14 WEEKS WITH OB TRANSVAGINAL  . Urinalysis, Routine w reflex microscopic  . CBC  . hCG, quantitative, pregnancy  . HIV Antibody (routine testing w rflx)  . Rh IG workup (includes ABO/Rh)  . Discharge patient   Meds ordered this encounter  Medications  . rho (d) immune globulin (RHIG/RHOPHYLAC) injection 300 mcg    MDM +UPT UA, wet prep, GC/chlamydia, CBC, ABO/Rh, quant hCG, and [place] today to rule out ectopic pregnancy  RH negative. Rhogam given today.   Ultrasound shows live IUP  ASSESSMENT 1. Normal IUP (intrauterine pregnancy) on prenatal ultrasound, first trimester   2. Vaginal bleeding in  pregnancy, first trimester   3. Abdominal cramping affecting pregnancy   4. Rh negative state in antepartum period, first trimester     PLAN Discharge home in stable condition. SAB precautions  Allergies as of 03/08/2018   No Known Allergies     Medication List    TAKE these medications   buprenorphine 2 MG Subl SL tablet Commonly known as:  SUBUTEX Place 2 mg under the tongue 2 (two) times daily.   multivitamin tablet Take 1 tablet by mouth daily.   prenatal vitamin w/FE, FA 27-1 MG Tabs tablet Take 1 tablet by mouth daily at 12 noon.        Judeth Horn, NP 03/08/2018  4:06 AM

## 2018-03-08 NOTE — MAU Note (Signed)
PT SAYS LAST Monday - STARTED SPOTTING-LIGHT TAN-WHEN WIPED AND UNDERWEAR-  HAS CONTINUED.   THEN TONIGHT  AT 0100- FELT SOMETHING COME OUT DARK BROWN - ON HER UNDERWEAR- . LAST SEX- Tuesday.  HAS HAD LIGHT  CRAMPS-  BUT AT 2 PM - WORSE WITH BACK ACHE - NO MEDS.

## 2018-03-09 LAB — RH IG WORKUP (INCLUDES ABO/RH)
ABO/RH(D): O NEG
ANTIBODY SCREEN: NEGATIVE
Gestational Age(Wks): 7
Unit division: 0

## 2018-03-11 ENCOUNTER — Other Ambulatory Visit: Payer: Self-pay | Admitting: Adult Health

## 2018-03-11 DIAGNOSIS — O3680X0 Pregnancy with inconclusive fetal viability, not applicable or unspecified: Secondary | ICD-10-CM

## 2018-03-11 LAB — GC/CHLAMYDIA PROBE AMP (~~LOC~~) NOT AT ARMC
Chlamydia: NEGATIVE
Neisseria Gonorrhea: NEGATIVE

## 2018-03-12 ENCOUNTER — Other Ambulatory Visit: Payer: Self-pay

## 2018-03-12 ENCOUNTER — Telehealth: Payer: Self-pay | Admitting: *Deleted

## 2018-03-12 NOTE — Telephone Encounter (Signed)
LMOVM that she does not need dating u/s today as she had one on Friday at [facility].  Also she needs to reschedule new OB as she will only be 8 weeks.  Advised to call and reschedule.

## 2018-03-15 ENCOUNTER — Encounter (HOSPITAL_COMMUNITY): Payer: Self-pay

## 2018-03-15 ENCOUNTER — Inpatient Hospital Stay (HOSPITAL_COMMUNITY)
Admission: AD | Admit: 2018-03-15 | Discharge: 2018-03-15 | Disposition: A | Discharge status: Home / Self Care | Payer: BLUE CROSS/BLUE SHIELD | Attending: Obstetrics & Gynecology | Admitting: Obstetrics & Gynecology

## 2018-03-15 ENCOUNTER — Inpatient Hospital Stay (HOSPITAL_COMMUNITY): Payer: BLUE CROSS/BLUE SHIELD

## 2018-03-15 ENCOUNTER — Other Ambulatory Visit: Payer: Self-pay

## 2018-03-15 DIAGNOSIS — O208 Other hemorrhage in early pregnancy: Secondary | ICD-10-CM | POA: Insufficient documentation

## 2018-03-15 DIAGNOSIS — Z87891 Personal history of nicotine dependence: Secondary | ICD-10-CM | POA: Diagnosis not present

## 2018-03-15 DIAGNOSIS — O418X10 Other specified disorders of amniotic fluid and membranes, first trimester, not applicable or unspecified: Secondary | ICD-10-CM | POA: Diagnosis not present

## 2018-03-15 DIAGNOSIS — Z3A01 Less than 8 weeks gestation of pregnancy: Secondary | ICD-10-CM | POA: Diagnosis not present

## 2018-03-15 DIAGNOSIS — O26851 Spotting complicating pregnancy, first trimester: Secondary | ICD-10-CM | POA: Diagnosis not present

## 2018-03-15 DIAGNOSIS — O468X1 Other antepartum hemorrhage, first trimester: Secondary | ICD-10-CM | POA: Diagnosis not present

## 2018-03-15 DIAGNOSIS — O209 Hemorrhage in early pregnancy, unspecified: Secondary | ICD-10-CM

## 2018-03-15 LAB — URINALYSIS, ROUTINE W REFLEX MICROSCOPIC
BILIRUBIN URINE: NEGATIVE
Glucose, UA: NEGATIVE mg/dL
Hgb urine dipstick: NEGATIVE
Ketones, ur: NEGATIVE mg/dL
Leukocytes,Ua: NEGATIVE
Nitrite: NEGATIVE
Protein, ur: NEGATIVE mg/dL
Specific Gravity, Urine: 1.004 — ABNORMAL LOW (ref 1.005–1.030)
pH: 7 (ref 5.0–8.0)

## 2018-03-15 LAB — CBC
HCT: 34.2 % — ABNORMAL LOW (ref 36.0–46.0)
Hemoglobin: 11.6 g/dL — ABNORMAL LOW (ref 12.0–15.0)
MCH: 30.9 pg (ref 26.0–34.0)
MCHC: 33.9 g/dL (ref 30.0–36.0)
MCV: 91.2 fL (ref 80.0–100.0)
Platelets: 129 10*3/uL — ABNORMAL LOW (ref 150–400)
RBC: 3.75 MIL/uL — ABNORMAL LOW (ref 3.87–5.11)
RDW: 12 % (ref 11.5–15.5)
WBC: 6 10*3/uL (ref 4.0–10.5)
nRBC: 0 % (ref 0.0–0.2)

## 2018-03-15 NOTE — MAU Provider Note (Signed)
Chief Complaint: Vaginal Bleeding   First Provider Initiated Contact with Patient 03/15/18 0516      SUBJECTIVE HPI: [name] is a 23 y.o. G2P0010 at [redacted]w[redacted]d by early ultrasound who presents to maternity admissions reporting brown spotting and passing small pieces of tissue today. She was evaluated in MAU on 03/08/18 for brown spotting and found to have live IUP at [redacted]w[redacted]d.  She was given rhophylac at that visit.  Brown spotting has been intermittent, not occurring every day since then.  Today, she passed small pieces of tissue that looked different than the bleeding so she came in for evaluation.  There is no pain. There are no other symptoms. She has not tried any treatments.    HPI  Past Medical History:  Diagnosis Date  . Chlamydia 10/16/2017   Treated 10/16/17  . Miscarriage 05/2017  . PCOS (polycystic ovarian syndrome)    Past Surgical History:  Procedure Laterality Date  . NO PAST SURGERIES     Social History   Socioeconomic History  . Marital status: Single    Spouse name: Not on file  . Number of children: Not on file  . Years of education: Not on file  . Highest education level: Not on file  Occupational History  . Not on file  Social Needs  . Financial resource strain: Not on file  . Food insecurity:    Worry: Not on file    Inability: Not on file  . Transportation needs:    Medical: Not on file    Non-medical: Not on file  Tobacco Use  . Smoking status: Former Smoker    Types: Cigarettes  . Smokeless tobacco: Never Used  Substance and Sexual Activity  . Alcohol use: Not Currently    Alcohol/week: 0.0 standard drinks    Comment: occ  . Drug use: No  . Sexual activity: Yes    Birth control/protection: None    Comment: INTERCOURSE AGE 80 , MORE THAN 5 SEXUAL PARTNERS  Lifestyle  . Physical activity:    Days per week: Not on file    Minutes per session: Not on file  . Stress: Not on file  Relationships  . Social connections:    Talks on phone: Not  on file    Gets together: Not on file    Attends religious service: Not on file    Active member of club or organization: Not on file    Attends meetings of clubs or organizations: Not on file    Relationship status: Not on file  . Intimate partner violence:    Fear of current or ex partner: Not on file    Emotionally abused: Not on file    Physically abused: Not on file    Forced sexual activity: Not on file  Other Topics Concern  . Not on file  Social History Narrative  . Not on file   No current facility-administered medications on file prior to encounter.    Current Outpatient Medications on File Prior to Encounter  Medication Sig Dispense Refill  . buprenorphine (SUBUTEX) 2 MG SUBL SL tablet Place 2 mg under the tongue 2 (two) times daily.    . prenatal vitamin w/FE, FA (PRENATAL 1 + 1) 27-1 MG TABS tablet Take 1 tablet by mouth daily at 12 noon. 30 each 12  . Multiple Vitamin (MULTIVITAMIN) tablet Take 1 tablet by mouth daily.     No Known Allergies  ROS:  Review of Systems  Constitutional: Negative for chills,  fatigue and fever.  Respiratory: Negative for shortness of breath.   Cardiovascular: Negative for chest pain.  Gastrointestinal: Negative for nausea and vomiting.  Genitourinary: Positive for vaginal bleeding and vaginal discharge. Negative for difficulty urinating, dysuria, flank pain, pelvic pain and vaginal pain.  Neurological: Negative for dizziness and headaches.  Psychiatric/Behavioral: Negative.      I have reviewed patient's Past Medical Hx, Surgical Hx, Family Hx, Social Hx, medications and allergies.   Physical Exam   Patient Vitals for the past 24 hrs:  BP Temp Pulse Resp SpO2 Height Weight  03/15/18 0442 115/73 98.5 F (36.9 C) 94 17 99 %  (1.702 m) 59.5 kg   Constitutional: Well-developed, well-nourished female in no acute distress.  Cardiovascular: normal rate Respiratory: normal effort GI: Abd soft, non-tender. Pos BS x 4 MS:  Extremities nontender, no edema, normal ROM Neurologic: Alert and oriented x 4.  GU: Neg CVAT.  PELVIC EXAM: Deferred   LAB RESULTS Results for orders placed or performed during the hospital encounter of 03/15/18 (from the past 24 hour(s))  CBC     Status: Abnormal   Collection Time: 03/15/18  5:23 AM  Result Value Ref Range   WBC 6.0 4.0 - 10.5 K/uL   RBC 3.75 (L) 3.87 - 5.11 MIL/uL   Hemoglobin 11.6 (L) 12.0 - 15.0 g/dL   HCT 40.9 (L) 81.1 - 91.4 %   MCV 91.2 80.0 - 100.0 fL   MCH 30.9 26.0 - 34.0 pg   MCHC 33.9 30.0 - 36.0 g/dL   RDW 78.2 95.6 - 21.3 %   Platelets 129 (L) 150 - 400 K/uL   nRBC 0.0 0.0 - 0.2 %    --/--/O NEG (03/06 0329)  IMAGING US Ob Transvaginal  Result Date: 03/15/2018 CLINICAL DATA:  Initial evaluation for acute vaginal bleeding, early pregnancy. EXAM: OBSTETRIC <14 WK [place] AND TRANSVAGINAL OB US TECHNIQUE: Both transabdominal and transvaginal ultrasound examinations were performed for complete evaluation of the gestation as well as the maternal uterus, adnexal regions, and pelvic cul-de-sac. Transvaginal technique was performed to assess early pregnancy. COMPARISON:  Prior ultrasound from 03/08/2018. FINDINGS: Intrauterine gestational sac: Single Yolk sac:  Present Embryo:  Present Cardiac Activity: Present Heart Rate: 157 bpm CRL: 12.0 mm   7 w   3 d                  [place] EDC: 10/29/2018 Subchorionic hemorrhage: Tiny subchorionic hemorrhage without mass effect. Maternal uterus/adnexae: Ovaries normal in appearance bilaterally. Small corpus luteal cyst noted on the left. No free fluid within the pelvis. IMPRESSION: 1. Single viable intrauterine pregnancy, estimated gestational age [redacted] weeks and 3 days by crown-rump length, with ultrasound EDC of 10/29/2018. 2. Tiny subchorionic hemorrhage without mass effect. 3. No other acute maternal uterine or adnexal abnormality. Electronically Signed   By: Rise Mu M.D.   On: 03/15/2018 05:55   US Ob Less Than 14  Weeks With Ob Transvaginal  Result Date: 03/08/2018 CLINICAL DATA:  Pregnant patient in first-trimester pregnancy with vaginal bleeding and cramping. EXAM: OBSTETRIC <14 WK [place] AND TRANSVAGINAL OB US TECHNIQUE: Both transabdominal and transvaginal ultrasound examinations were performed for complete evaluation of the gestation as well as the maternal uterus, adnexal regions, and pelvic cul-de-sac. Transvaginal technique was performed to assess early pregnancy. COMPARISON:  None. FINDINGS: Intrauterine gestational sac: Single Yolk sac:  Visualized. Embryo:  Visualized. Cardiac Activity: Visualized. Heart Rate: 120 bpm CRL: 2.78 mm   5 w   5 d  [place] EDC: 11/03/2018 Subchorionic hemorrhage:  None visualized. Maternal uterus/adnexae: Both ovaries are visualized and are normal. No significant pelvic free fluid. No adnexal mass. IMPRESSION: Single live intrauterine pregnancy estimated gestational age 80 days 5 weeks based on crown-rump length for estimated date of delivery 11/03/2018. No subchorionic hemorrhage. Electronically Signed   By: Narda Rutherford M.D.   On: 03/08/2018 03:34    MAU Management/MDM: Orders Placed This Encounter  Procedures  . US OB Transvaginal  . Urinalysis, Routine w reflex microscopic  . CBC  . Discharge patient    No orders of the defined types were placed in this encounter.   [place] confirms live IUP, dates c/w previous US. Tiny subchorionic hemorrhage noted. Reassurance provided to pt. Discussed SCH and good prognosis. Pt has moved to [facility] and would like prenatal care here, instead of at [facility]. Message sent to [facility] to establish care per pt preference.  Return to MAU as needed for emergencies.  Pt discharged with strict bleeding precautions.  ASSESSMENT 1. Subchorionic hemorrhage of placenta in first trimester, single or unspecified fetus   2. Vaginal bleeding in pregnancy, first trimester     PLAN Discharge home Rhophylac given at previous MAU visit  03/08/18 Start prenatal care with [facility] as desired  Allergies as of 03/15/2018   No Known Allergies     Medication List    TAKE these medications   buprenorphine 2 MG Subl SL tablet Commonly known as:  SUBUTEX Place 2 mg under the tongue 2 (two) times daily.   multivitamin tablet Take 1 tablet by mouth daily.   prenatal vitamin w/FE, FA 27-1 MG Tabs tablet Take 1 tablet by mouth daily at 12 noon.      Follow-up Information    [facility] Follow up.   Why:  As scheduled Contact information: [address] [place] [address] [phone]       [facility] Follow up.   Why:  As needed for emergencies. Contact information: [address] [place] [phone]          Sharen Counter Certified Nurse-Midwife 03/15/2018  6:25 AM

## 2018-03-15 NOTE — MAU Note (Signed)
States she has had ongoing vaginal bleeding since before last week.  3 hours ago had something she said looked like tissue come out.  States her bleeding increased around 1400 yesterday, though it is still brown.  Mild cramps that come and go-not taking anything for pain.

## 2018-03-26 ENCOUNTER — Ambulatory Visit: Payer: Self-pay | Admitting: *Deleted

## 2018-03-26 ENCOUNTER — Encounter: Payer: Self-pay | Admitting: Women's Health

## 2018-04-09 ENCOUNTER — Ambulatory Visit (INDEPENDENT_AMBULATORY_CARE_PROVIDER_SITE_OTHER): Payer: BLUE CROSS/BLUE SHIELD | Admitting: Student

## 2018-04-09 ENCOUNTER — Encounter: Payer: Self-pay | Admitting: Student

## 2018-04-09 ENCOUNTER — Other Ambulatory Visit: Payer: Self-pay

## 2018-04-09 VITALS — BP 108/62 | HR 77 | Wt 127.9 lb

## 2018-04-09 DIAGNOSIS — Z113 Encounter for screening for infections with a predominantly sexual mode of transmission: Secondary | ICD-10-CM | POA: Diagnosis not present

## 2018-04-09 DIAGNOSIS — O360910 Maternal care for other rhesus isoimmunization, first trimester, not applicable or unspecified: Secondary | ICD-10-CM

## 2018-04-09 DIAGNOSIS — Z3480 Encounter for supervision of other normal pregnancy, unspecified trimester: Secondary | ICD-10-CM | POA: Diagnosis not present

## 2018-04-09 DIAGNOSIS — N898 Other specified noninflammatory disorders of vagina: Secondary | ICD-10-CM | POA: Diagnosis not present

## 2018-04-09 DIAGNOSIS — O26891 Other specified pregnancy related conditions, first trimester: Secondary | ICD-10-CM

## 2018-04-09 DIAGNOSIS — Z6791 Unspecified blood type, Rh negative: Secondary | ICD-10-CM | POA: Insufficient documentation

## 2018-04-09 DIAGNOSIS — Z3481 Encounter for supervision of other normal pregnancy, first trimester: Secondary | ICD-10-CM

## 2018-04-09 DIAGNOSIS — Z3A10 10 weeks gestation of pregnancy: Secondary | ICD-10-CM

## 2018-04-09 DIAGNOSIS — O26899 Other specified pregnancy related conditions, unspecified trimester: Secondary | ICD-10-CM

## 2018-04-09 NOTE — Progress Notes (Signed)
New OB.  Declined FLU and Genetic Screening. Last PAP 11/05/2017. BP Cuff given.

## 2018-04-09 NOTE — Patient Instructions (Signed)
First Trimester of Pregnancy  The first trimester of pregnancy is from week 1 until the end of week 13 (months 1 through 3). During this time, your baby will begin to develop inside you. At 6-8 weeks, the eyes and face are formed, and the heartbeat can be seen on ultrasound. At the end of 12 weeks, all the baby's organs are formed. Prenatal care is all the medical care you receive before the birth of your baby. Make sure you get good prenatal care and follow all of your doctor's instructions. Follow these instructions at home: Medicines  Take over-the-counter and prescription medicines only as told by your doctor. Some medicines are safe and some medicines are not safe during pregnancy.  Take a prenatal vitamin that contains at least 600 micrograms (mcg) of folic acid.  If you have trouble pooping (constipation), take medicine that will make your stool soft (stool softener) if your doctor approves. Eating and drinking   Eat regular, healthy meals.  Your doctor will tell you the amount of weight gain that is right for you.  Avoid raw meat and uncooked cheese.  If you feel sick to your stomach (nauseous) or throw up (vomit): ? Eat 4 or 5 small meals a day instead of 3 large meals. ? Try eating a few soda crackers. ? Drink liquids between meals instead of during meals.  To prevent constipation: ? Eat foods that are high in fiber, like fresh fruits and vegetables, whole grains, and beans. ? Drink enough fluids to keep your pee (urine) clear or pale yellow. Activity  Exercise only as told by your doctor. Stop exercising if you have cramps or pain in your lower belly (abdomen) or low back.  Do not exercise if it is too hot, too humid, or if you are in a place of great height (high altitude).  Try to avoid standing for long periods of time. Move your legs often if you must stand in one place for a long time.  Avoid heavy lifting.  Wear low-heeled shoes. Sit and stand up straight.   You can have sex unless your doctor tells you not to. Relieving pain and discomfort  Wear a good support bra if your breasts are sore.  Take warm water baths (sitz baths) to soothe pain or discomfort caused by hemorrhoids. Use hemorrhoid cream if your doctor says it is okay.  Rest with your legs raised if you have leg cramps or low back pain.  If you have puffy, bulging veins (varicose veins) in your legs: ? Wear support hose or compression stockings as told by your doctor. ? Raise (elevate) your feet for 15 minutes, 3-4 times a day. ? Limit salt in your food. Prenatal care  Schedule your prenatal visits by the twelfth week of pregnancy.  Write down your questions. Take them to your prenatal visits.  Keep all your prenatal visits as told by your doctor. This is important. Safety  Wear your seat belt at all times when driving.  Make a list of emergency phone numbers. The list should include numbers for family, friends, the hospital, and police and fire departments. General instructions  Ask your doctor for a referral to a local prenatal class. Begin classes no later than at the start of month 6 of your pregnancy.  Ask for help if you need counseling or if you need help with nutrition. Your doctor can give you advice or tell you where to go for help.  Do not use hot tubs, steam   rooms, or saunas.  Do not douche or use tampons or scented sanitary pads.  Do not cross your legs for long periods of time.  Avoid all herbs and alcohol. Avoid drugs that are not approved by your doctor.  Do not use any tobacco products, including cigarettes, chewing tobacco, and electronic cigarettes. If you need help quitting, ask your doctor. You may get counseling or other support to help you quit.  Avoid cat litter boxes and soil used by cats. These carry germs that can cause birth defects in the baby and can cause a loss of your baby (miscarriage) or stillbirth.  Visit your dentist. At home,  brush your teeth with a soft toothbrush. Be gentle when you floss. Contact a doctor if:  You are dizzy.  You have mild cramps or pressure in your lower belly.  You have a nagging pain in your belly area.  You continue to feel sick to your stomach, you throw up, or you have watery poop (diarrhea).  You have a bad smelling fluid coming from your vagina.  You have pain when you pee (urinate).  You have increased puffiness (swelling) in your face, hands, legs, or ankles. Get help right away if:  You have a fever.  You are leaking fluid from your vagina.  You have spotting or bleeding from your vagina.  You have very bad belly cramping or pain.  You gain or lose weight rapidly.  You throw up blood. It may look like coffee grounds.  You are around people who have German measles, fifth disease, or chickenpox.  You have a very bad headache.  You have shortness of breath.  You have any kind of trauma, such as from a fall or a car accident. Summary  The first trimester of pregnancy is from week 1 until the end of week 13 (months 1 through 3).  To take care of yourself and your unborn baby, you will need to eat healthy meals, take medicines only if your doctor tells you to do so, and do activities that are safe for you and your baby.  Keep all follow-up visits as told by your doctor. This is important as your doctor will have to ensure that your baby is healthy and growing well. This information is not intended to replace advice given to you by your health care provider. Make sure you discuss any questions you have with your health care provider. Document Released: 06/07/2007 Document Revised: 12/28/2015 Document Reviewed: 12/28/2015 Elsevier Interactive Patient Education  2019 [employer]  

## 2018-04-09 NOTE — Progress Notes (Signed)
  Subjective:    [name] is being seen today for her first obstetrical visit.  This is a planned pregnancy. She is at [redacted]w[redacted]d gestation. Her obstetrical history is significant for PCOS. Relationship with FOB: significant other, living together. Patient does intend to breast feed. Pregnancy history fully reviewed.  Patient reports no complaints other than she sometimes feels weak and dizzy. Is not vomiting, but feels that her blood sugar sometimes gets low. .  Review of Systems:   Review of Systems  Constitutional: Negative.   HENT: Negative.   Respiratory: Negative.   Cardiovascular: Negative.   Gastrointestinal: Negative.   Genitourinary: Negative.   Musculoskeletal: Negative.     Objective:     BP 108/62   Pulse 77   Wt 127 lb 14.4 oz (58 kg)   LMP 01/12/2018 (Approximate)   BMI 20.03 kg/m  Physical Exam  Constitutional: She is oriented to person, place, and time. She appears well-developed.  HENT:  Head: Normocephalic.  Neck: Normal range of motion.  Respiratory: Effort normal.  GI: Soft.  Genitourinary:    Vagina normal.   Musculoskeletal: Normal range of motion.  Neurological: She is alert and oriented to person, place, and time.  Skin: Skin is warm and dry.    Exam    Assessment:    Pregnancy: [id number] Patient Active Problem List   Diagnosis Date Noted  . Supervision of other normal pregnancy, antepartum 04/09/2018  . Rh negative state in antepartum period 04/09/2018  . Pregnancy examination or test, positive result 02/25/2018  . Less than [redacted] weeks gestation of pregnancy 02/25/2018  . Encounter to determine fetal viability of pregnancy 02/25/2018  . Chlamydia 10/16/2017  . Screening examination for STD (sexually transmitted disease) 10/09/2017  . BV (bacterial vaginosis) 08/15/2017  . Vaginal discharge 08/15/2017  . Irregular periods 08/15/2017  . Vaginal odor 08/15/2017  . Pregnancy examination or test, negative result 08/15/2017  . Blighted  ovum 04/27/2017  . Irregular periods/menstrual cycles 02/19/2014       Plan:     Initial labs drawn. Prenatal vitamins. Problem list reviewed and updated. AFP3 discussed: too early, can do at visit in 10 weeks. . Role of ultrasound in pregnancy discussed; fetal survey: ordered. Amniocentesis discussed: declined. Follow up in 10  weeks. 50 % of 30 min visit spent on counseling and coordination of care.   -The nature of [facility] with multiple MDs and other Advanced Practice Providers was explained to patient; also emphasized that residents, students are part of our team. -Patient will do genetic testing today -Enrolled in [employer]; explained importance of monitoring blood pressure weekly and reviewed warning signs -[place] ordered  Charlesetta Garibaldi [facility] 04/09/2018

## 2018-04-10 ENCOUNTER — Ambulatory Visit: Payer: BLUE CROSS/BLUE SHIELD | Admitting: *Deleted

## 2018-04-10 ENCOUNTER — Encounter: Payer: BLUE CROSS/BLUE SHIELD | Admitting: Advanced Practice Midwife

## 2018-04-10 LAB — OBSTETRIC PANEL, INCLUDING HIV
Basophils Absolute: 0 10*3/uL (ref 0.0–0.2)
Basos: 0 %
EOS (ABSOLUTE): 0.1 10*3/uL (ref 0.0–0.4)
Eos: 1 %
HIV Screen 4th Generation wRfx: NONREACTIVE
Hematocrit: 40.4 % (ref 34.0–46.6)
Hemoglobin: 13.5 g/dL (ref 11.1–15.9)
Hepatitis B Surface Ag: NEGATIVE
Immature Grans (Abs): 0 10*3/uL (ref 0.0–0.1)
Immature Granulocytes: 0 %
Lymphocytes Absolute: 1.1 10*3/uL (ref 0.7–3.1)
Lymphs: 18 %
MCH: 31.1 pg (ref 26.6–33.0)
MCHC: 33.4 g/dL (ref 31.5–35.7)
MCV: 93 fL (ref 79–97)
Monocytes Absolute: 0.3 10*3/uL (ref 0.1–0.9)
Monocytes: 5 %
Neutrophils Absolute: 4.9 10*3/uL (ref 1.4–7.0)
Neutrophils: 76 %
Platelets: 151 10*3/uL (ref 150–450)
RBC: 4.34 x10E6/uL (ref 3.77–5.28)
RDW: 12.3 % (ref 11.7–15.4)
RPR Ser Ql: NONREACTIVE
Rh Factor: NEGATIVE
Rubella Antibodies, IGG: 1.48 index (ref >0.99)
WBC: 6.4 10*3/uL (ref 3.4–10.8)

## 2018-04-10 LAB — CERVICOVAGINAL ANCILLARY ONLY
Bacterial vaginitis: NEGATIVE
Candida vaginitis: NEGATIVE
Trichomonas: NEGATIVE

## 2018-04-10 LAB — AB SCR+ANTIBODY ID: Antibody Screen: POSITIVE — AB

## 2018-04-12 LAB — URINE CULTURE, OB REFLEX

## 2018-04-12 LAB — CULTURE, OB URINE

## 2018-04-16 ENCOUNTER — Encounter: Payer: Self-pay | Admitting: Student

## 2018-04-17 ENCOUNTER — Encounter: Payer: Self-pay | Admitting: Obstetrics

## 2018-04-17 ENCOUNTER — Encounter: Payer: Self-pay | Admitting: Student

## 2018-04-17 DIAGNOSIS — O360190 Maternal care for anti-D [Rh] antibodies, unspecified trimester, not applicable or unspecified: Secondary | ICD-10-CM | POA: Insufficient documentation

## 2018-06-10 ENCOUNTER — Ambulatory Visit (HOSPITAL_COMMUNITY): Payer: BC Managed Care – PPO | Attending: Obstetrics and Gynecology

## 2018-06-11 DIAGNOSIS — R109 Unspecified abdominal pain: Secondary | ICD-10-CM | POA: Diagnosis not present

## 2018-06-11 DIAGNOSIS — O26892 Other specified pregnancy related conditions, second trimester: Secondary | ICD-10-CM | POA: Diagnosis not present

## 2018-06-11 DIAGNOSIS — Z3A19 19 weeks gestation of pregnancy: Secondary | ICD-10-CM | POA: Diagnosis not present

## 2018-06-19 ENCOUNTER — Encounter: Payer: BC Managed Care – PPO | Admitting: Certified Nurse Midwife

## 2018-06-21 ENCOUNTER — Telehealth: Payer: Self-pay | Admitting: Certified Nurse Midwife

## 2018-06-27 DIAGNOSIS — O99322 Drug use complicating pregnancy, second trimester: Secondary | ICD-10-CM | POA: Diagnosis not present

## 2018-06-27 DIAGNOSIS — Z3689 Encounter for other specified antenatal screening: Secondary | ICD-10-CM | POA: Diagnosis not present

## 2018-06-27 DIAGNOSIS — F1120 Opioid dependence, uncomplicated: Secondary | ICD-10-CM | POA: Diagnosis not present

## 2018-06-27 DIAGNOSIS — Z87898 Personal history of other specified conditions: Secondary | ICD-10-CM | POA: Diagnosis not present

## 2018-06-27 DIAGNOSIS — Z3A21 21 weeks gestation of pregnancy: Secondary | ICD-10-CM | POA: Diagnosis not present

## 2018-06-27 DIAGNOSIS — O99320 Drug use complicating pregnancy, unspecified trimester: Secondary | ICD-10-CM | POA: Diagnosis not present

## 2018-07-02 DIAGNOSIS — Z5181 Encounter for therapeutic drug level monitoring: Secondary | ICD-10-CM | POA: Diagnosis not present

## 2018-07-02 DIAGNOSIS — Z79899 Other long term (current) drug therapy: Secondary | ICD-10-CM | POA: Diagnosis not present

## 2018-07-02 DIAGNOSIS — O99322 Drug use complicating pregnancy, second trimester: Secondary | ICD-10-CM | POA: Diagnosis not present

## 2018-07-02 DIAGNOSIS — F1120 Opioid dependence, uncomplicated: Secondary | ICD-10-CM | POA: Diagnosis not present

## 2018-09-23 ENCOUNTER — Encounter: Payer: Self-pay | Admitting: Gynecology
